# Patient Record
Sex: Male | Born: 1935 | Race: White | Hispanic: No | Marital: Married | Smoking: Never smoker
Health system: Southern US, Community
[De-identification: ages and names within clinical notes are randomized; demographics above are authoritative.]

## PROBLEM LIST (undated history)

## (undated) DIAGNOSIS — N184 Chronic kidney disease, stage 4 (severe): Secondary | ICD-10-CM

## (undated) DIAGNOSIS — M4692 Unspecified inflammatory spondylopathy, cervical region: Secondary | ICD-10-CM

## (undated) DIAGNOSIS — I872 Venous insufficiency (chronic) (peripheral): Secondary | ICD-10-CM

## (undated) DIAGNOSIS — E1129 Type 2 diabetes mellitus with other diabetic kidney complication: Secondary | ICD-10-CM

## (undated) DIAGNOSIS — I119 Hypertensive heart disease without heart failure: Secondary | ICD-10-CM

## (undated) DIAGNOSIS — E785 Hyperlipidemia, unspecified: Secondary | ICD-10-CM

## (undated) DIAGNOSIS — D631 Anemia in chronic kidney disease: Secondary | ICD-10-CM

## (undated) DIAGNOSIS — R0602 Shortness of breath: Secondary | ICD-10-CM

## (undated) DIAGNOSIS — N400 Enlarged prostate without lower urinary tract symptoms: Secondary | ICD-10-CM

## (undated) DIAGNOSIS — I2510 Atherosclerotic heart disease of native coronary artery without angina pectoris: Secondary | ICD-10-CM

## (undated) DIAGNOSIS — E559 Vitamin D deficiency, unspecified: Secondary | ICD-10-CM

## (undated) DIAGNOSIS — I311 Chronic constrictive pericarditis: Secondary | ICD-10-CM

## (undated) DIAGNOSIS — M1990 Unspecified osteoarthritis, unspecified site: Secondary | ICD-10-CM

## (undated) DIAGNOSIS — N039 Chronic nephritic syndrome with unspecified morphologic changes: Secondary | ICD-10-CM

## (undated) DIAGNOSIS — I5022 Chronic systolic (congestive) heart failure: Secondary | ICD-10-CM

## (undated) DIAGNOSIS — E669 Obesity, unspecified: Secondary | ICD-10-CM

## (undated) DIAGNOSIS — E11319 Type 2 diabetes mellitus with unspecified diabetic retinopathy without macular edema: Secondary | ICD-10-CM

## (undated) HISTORY — DX: Type 2 diabetes mellitus with unspecified diabetic retinopathy without macular edema: E11.319

## (undated) HISTORY — PX: CATARACT EXTRACTION W/ INTRAOCULAR LENS  IMPLANT, BILATERAL: SHX1307

## (undated) HISTORY — DX: Chronic nephritic syndrome with unspecified morphologic changes: N03.9

## (undated) HISTORY — DX: Type 2 diabetes mellitus with other diabetic kidney complication: E11.29

## (undated) HISTORY — DX: Obesity, unspecified: E66.9

## (undated) HISTORY — DX: Anemia in chronic kidney disease: D63.1

## (undated) HISTORY — DX: Venous insufficiency (chronic) (peripheral): I87.2

## (undated) HISTORY — DX: Vitamin D deficiency, unspecified: E55.9

## (undated) HISTORY — PX: PERICARDIECTOMY: SHX2214

## (undated) HISTORY — DX: Unspecified inflammatory spondylopathy, cervical region: M46.92

---

## 1979-05-11 HISTORY — PX: INGUINAL HERNIA REPAIR: SUR1180

## 1998-01-23 ENCOUNTER — Ambulatory Visit (HOSPITAL_COMMUNITY): Admission: RE | Admit: 1998-01-23 | Discharge: 1998-01-23 | Payer: Self-pay | Admitting: Ophthalmology

## 1998-08-27 ENCOUNTER — Emergency Department (HOSPITAL_COMMUNITY): Admission: EM | Admit: 1998-08-27 | Discharge: 1998-08-27 | Payer: Self-pay | Admitting: Emergency Medicine

## 1998-08-27 ENCOUNTER — Encounter: Payer: Self-pay | Admitting: Emergency Medicine

## 1999-01-23 ENCOUNTER — Encounter: Payer: Self-pay | Admitting: Emergency Medicine

## 1999-01-24 ENCOUNTER — Inpatient Hospital Stay (HOSPITAL_COMMUNITY): Admission: EM | Admit: 1999-01-24 | Discharge: 1999-02-06 | Payer: Self-pay | Admitting: Internal Medicine

## 1999-01-25 ENCOUNTER — Encounter (HOSPITAL_BASED_OUTPATIENT_CLINIC_OR_DEPARTMENT_OTHER): Payer: Self-pay | Admitting: Internal Medicine

## 1999-01-26 ENCOUNTER — Encounter (HOSPITAL_BASED_OUTPATIENT_CLINIC_OR_DEPARTMENT_OTHER): Payer: Self-pay | Admitting: Internal Medicine

## 1999-01-28 ENCOUNTER — Encounter: Payer: Self-pay | Admitting: Cardiology

## 1999-01-31 ENCOUNTER — Encounter: Payer: Self-pay | Admitting: Surgery

## 1999-02-01 ENCOUNTER — Encounter: Payer: Self-pay | Admitting: Surgery

## 1999-02-02 ENCOUNTER — Encounter: Payer: Self-pay | Admitting: Cardiology

## 1999-02-04 ENCOUNTER — Encounter: Payer: Self-pay | Admitting: Surgery

## 1999-02-05 ENCOUNTER — Encounter: Payer: Self-pay | Admitting: Cardiothoracic Surgery

## 2000-05-22 ENCOUNTER — Other Ambulatory Visit: Admission: RE | Admit: 2000-05-22 | Discharge: 2000-05-22 | Payer: Self-pay | Admitting: Urology

## 2000-05-22 ENCOUNTER — Encounter (INDEPENDENT_AMBULATORY_CARE_PROVIDER_SITE_OTHER): Payer: Self-pay | Admitting: Specialist

## 2001-01-12 ENCOUNTER — Encounter: Admission: RE | Admit: 2001-01-12 | Discharge: 2001-04-12 | Payer: Self-pay | Admitting: Internal Medicine

## 2001-05-01 ENCOUNTER — Encounter: Admission: RE | Admit: 2001-05-01 | Discharge: 2001-07-30 | Payer: Self-pay | Admitting: Internal Medicine

## 2003-09-10 HISTORY — PX: CARDIAC CATHETERIZATION: SHX172

## 2004-07-27 ENCOUNTER — Ambulatory Visit (HOSPITAL_COMMUNITY): Admission: RE | Admit: 2004-07-27 | Discharge: 2004-07-27 | Payer: Self-pay | Admitting: Cardiology

## 2004-12-29 ENCOUNTER — Encounter: Admission: RE | Admit: 2004-12-29 | Discharge: 2004-12-29 | Payer: Self-pay | Admitting: Orthopedic Surgery

## 2005-01-06 ENCOUNTER — Encounter: Admission: RE | Admit: 2005-01-06 | Discharge: 2005-01-06 | Payer: Self-pay | Admitting: Internal Medicine

## 2005-01-17 ENCOUNTER — Encounter: Admission: RE | Admit: 2005-01-17 | Discharge: 2005-01-17 | Payer: Self-pay | Admitting: Orthopedic Surgery

## 2005-01-18 ENCOUNTER — Encounter: Admission: RE | Admit: 2005-01-18 | Discharge: 2005-01-18 | Payer: Self-pay | Admitting: Orthopedic Surgery

## 2005-06-14 ENCOUNTER — Encounter (HOSPITAL_BASED_OUTPATIENT_CLINIC_OR_DEPARTMENT_OTHER): Admission: RE | Admit: 2005-06-14 | Discharge: 2005-07-02 | Payer: Self-pay | Admitting: Surgery

## 2005-12-29 IMAGING — CT CT L SPINE W/ CM
3 of 11 series · 12 of 33 positions shown, 15 images · IV contrast (omnipaque)
Comparison: none

CLINICAL DATA: This patient has pain and weakness in his lower legs bilaterally.  He is referred for lumbar myelogram. 
 LUMBAR MYELOGRAM:
 Following informed consent, using sterile technique and fluoroscopic guidance, a 22 gauge spinal needle was placed into the thecal sac at the L-3 level.  CSF is clear.  15 cc Omnipaque 180 contrast was injected.  The needle was withdrawn and multiple images were obtained. 
 The patient has five non-rib bearing lumbar vertebrae.  Facet degenerative changes lower lumbar spine.  There is degenerative disc disease appreciated at L4-5 and L5-S1.  No appreciable listhesis.  The nerve roots appear to fill normally.

[Series 4: recon 3: l-spine helical · axial · 0.27mm/px · z∈[-229,-79]mm · 5 of 376 slices shown, 7 images]
[im 63/376  soft-tissue]
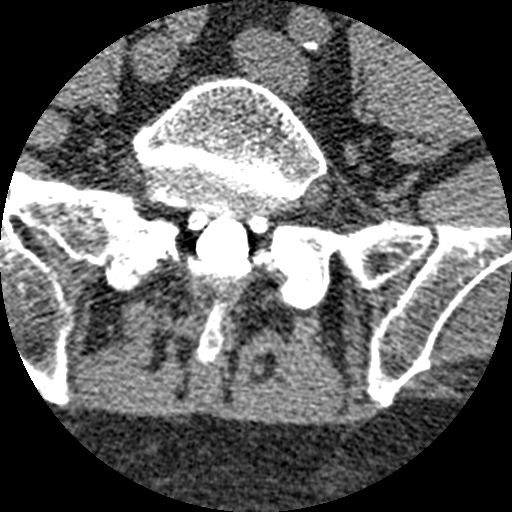
[im 63/376  bone]
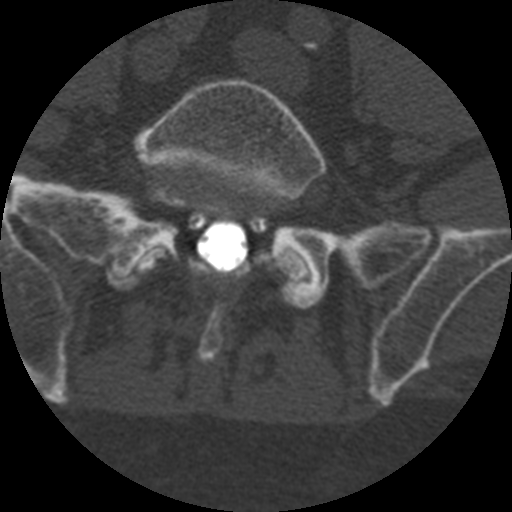
[im 126/376  bone]
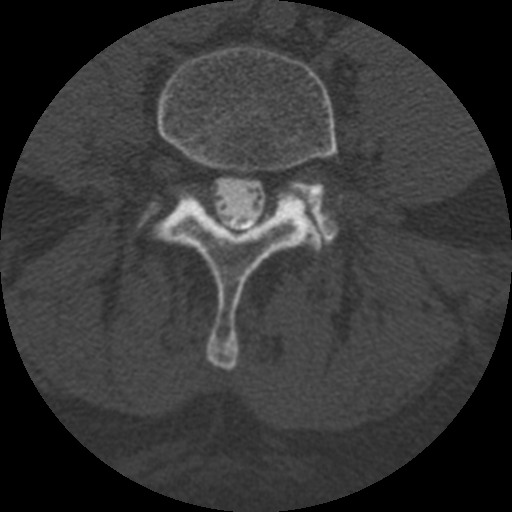
[im 188/376  bone]
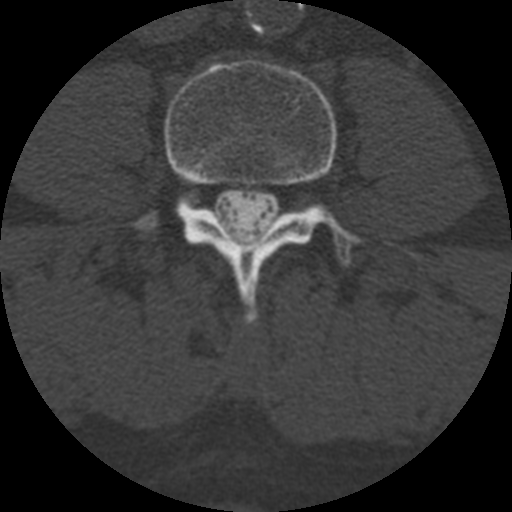
[im 251/376  bone]
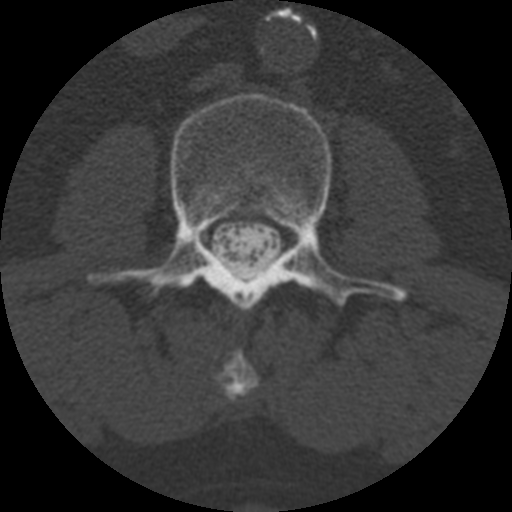
[im 313/376  soft-tissue]
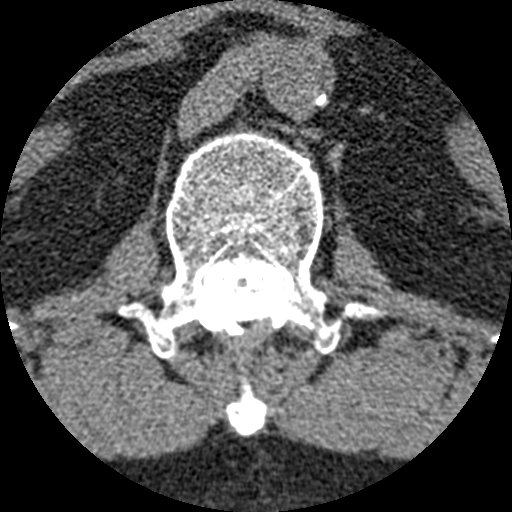
[im 313/376  bone]
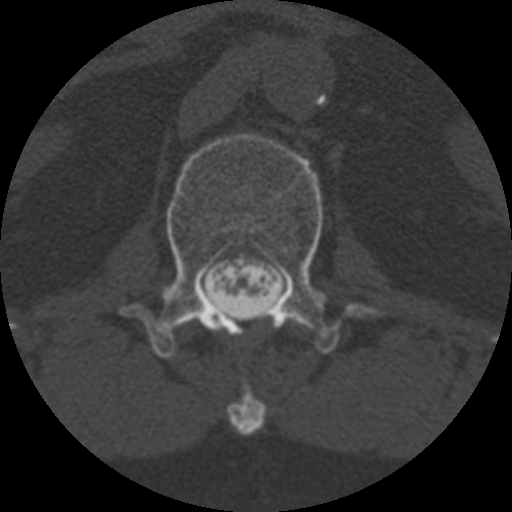

[Series 400: reformatted · sagittal · 0.45mm/px · 5 of 40 slices shown, 6 images (1 of 2)]
[im 14/40  bone]
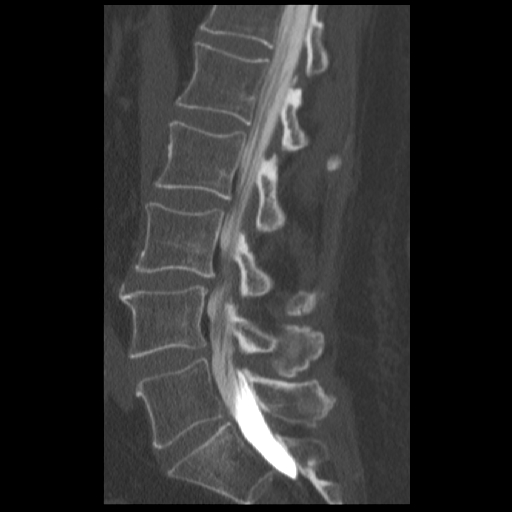
[im 17/40  bone]
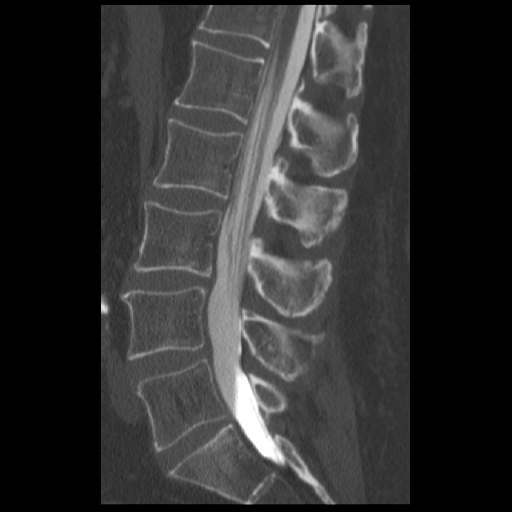
[im 20/40  soft-tissue]
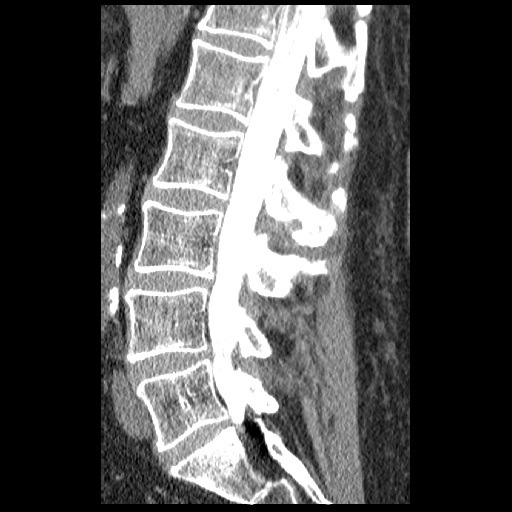
[im 20/40  bone]
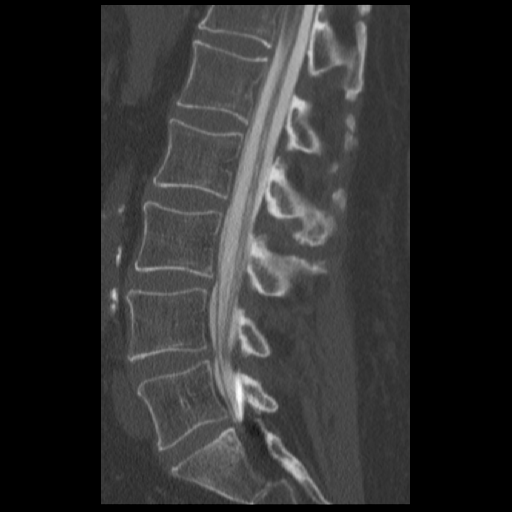
[im 23/40  bone]
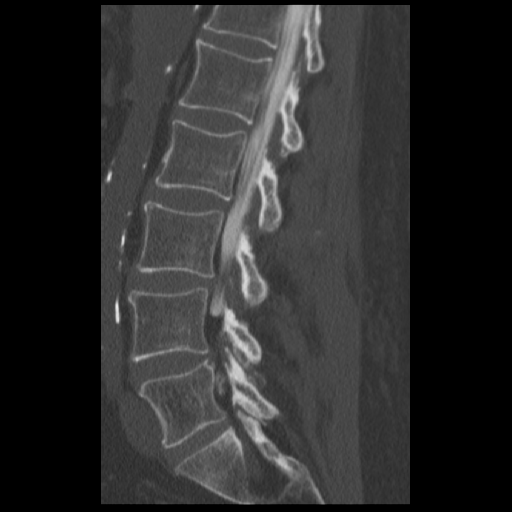
[im 27/40  bone]
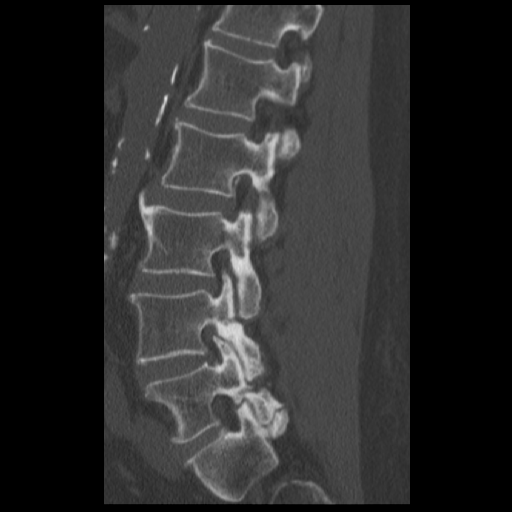

[Series 401: reformatted · coronal · 0.45mm/px · 2 of 20 slices shown (2 of 2)]
[im 4/20  bone]
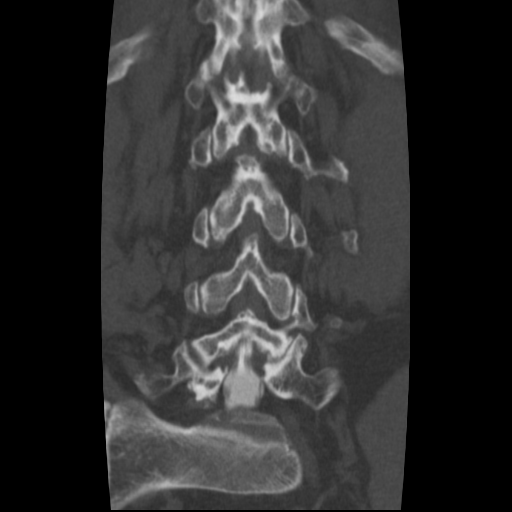
[im 8/20  bone]
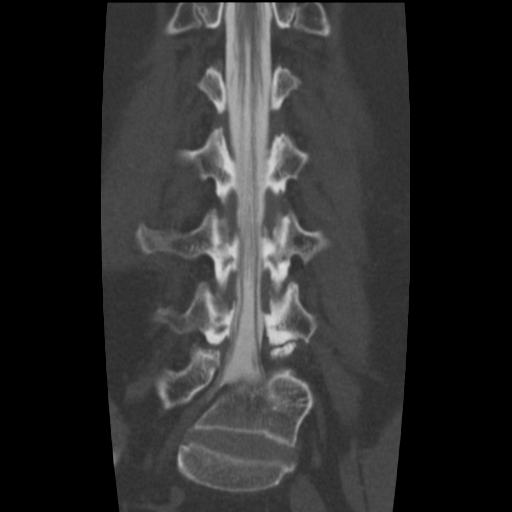

[12 of 33 positions shown; findings below may reference images not displayed]

IMPRESSION: No appreciable stenosis.  CT to follow. 
 CT LUMBAR SPINE POST-INTRATHECAL CONTRAST:
 The conus terminates at L-1 which is unremarkable.  On the sagittal reformat images, shallow disc protrusion is appreciated at L3-4 and L4-5.  There is no appreciable central stenosis.  Multifactorial narrowing of the left L-4 foramen without appreciable encroachment upon the dorsal root ganglion. 
 Axial imaging:
 L1-2:  Negative for central or neuroforaminal stenosis. 
 L2-3:  Mild facet degenerative change.
 L3-4:  Disc bulge without focal protrusion.  Facet degenerative change bilaterally with superior facet bony overgrowth.  This does narrow the inferior foramina left greater than right without appreciable encroachment upon the dorsal root ganglia.   No appreciable lateral recess stenosis. 
 L4-5:  Disc bulge eccentric left.  Facet osteoarthritic change bilaterally.  There is no appreciable central nor lateral recess stenosis.  Narrowing of the inferior left L-4 foramen without encroachment upon the dorsal root ganglion. 
 L5-S1:  Facet degenerative change bilaterally.  Disc bulge.  There is no focal protrusion.  There is no central stenosis.  The L-5 roots exit without encroachment.
IMPRESSION: The patient has multilevel degenerative changes as described above.  There is no appreciable disc herniation.  There is no central stenosis or severe foraminal stenosis to clearly account for the patient?s lower leg symptoms.

## 2006-02-01 ENCOUNTER — Encounter: Admission: RE | Admit: 2006-02-01 | Discharge: 2006-02-01 | Payer: Self-pay | Admitting: Neurology

## 2007-04-13 ENCOUNTER — Ambulatory Visit: Payer: Self-pay | Admitting: Internal Medicine

## 2007-05-27 ENCOUNTER — Ambulatory Visit: Payer: Self-pay | Admitting: Internal Medicine

## 2007-08-11 ENCOUNTER — Encounter: Admission: RE | Admit: 2007-08-11 | Discharge: 2007-08-11 | Payer: Self-pay | Admitting: Internal Medicine

## 2010-01-25 ENCOUNTER — Encounter: Admission: RE | Admit: 2010-01-25 | Discharge: 2010-01-25 | Payer: Self-pay | Admitting: Neurology

## 2010-09-30 ENCOUNTER — Encounter: Payer: Self-pay | Admitting: Internal Medicine

## 2010-09-30 ENCOUNTER — Encounter: Payer: Self-pay | Admitting: Orthopedic Surgery

## 2011-01-22 NOTE — Assessment & Plan Note (Signed)
[facility]                         GASTROENTEROLOGY OFFICE NOTE   ALANZO, LAMB                     MRN:          [id number]  DATE:04/13/2007                            DOB:          [birth year]    GASTROENTEROLOGY CONSULTATION   REASON FOR CONSULTATION:  Screening colonoscopy in an insulin-dependent  diabetic.   HISTORY:  This is a 77 year old white male type 1 diabetic who was  referred through the courtesy of Dr. Jacky Kindle regarding a screening  colonoscopy.  The patient has had type 1 diabetes for 43 years.  He has  been on an insulin pump for 15 years.  He states he underwent flexible  sigmoidoscopy over 10 years ago.  No abnormalities at that time.  Currently without active GI symptoms.  No abdominal pain, change in  bowel habits or bleeding.  He is interested in screening colonoscopy.   PAST MEDICAL HISTORY:  1. Type 1 diabetes x43 years.  2. Pericardial stripping for unspecified reasons.  3. Stasis dermatitis.  4. ALLERGY TO INTRAVENOUS CONTRAST.   CURRENT MEDICATIONS:  1. Benicar 20 mg daily.  2. Aspirin 81 mg daily.  3. Simvastatin 40 mg daily.  4. Furosemide 40 mg daily.  5. NovoLog Insulin pump.   FAMILY HISTORY:  No family history of gastrointestinal malignancy.  Brother with diabetes.   SOCIAL HISTORY:  The patient is married without children.  He lives with  his wife.  He attended high school and college.  He is a Chief of Staff.  He does not smoke or use alcohol.   REVIEW OF SYSTEMS:  Per diagnostic evaluation form.   PHYSICAL EXAMINATION:  GENERAL APPEARANCE:  Elderly male in no acute  distress.  VITAL SIGNS:  Blood pressure 100/56.  Heart rate 68.  Weight is 223  pounds.  He is 5 feet, 9 inches in height.  HEENT:  Sclerae anicteric.  Conjunctivae pink.  Oral mucosa is intact.  NECK:  No adenopathy.  LUNGS:  Clear.  HEART:  Regular.  CHEST:  There is a median sternotomy scar  that is well-healed.  ABDOMEN:  Obese, soft without tenderness, mass or hernia.  Insulin pump  is in place.  EXTREMITIES:  Without edema, although he does have stasis changes  bilaterally.   IMPRESSION:  This is a 77 year old insulin-requiring diabetic who  presents regarding screening colonoscopy.  He is an appropriate  candidate without contraindication.  The nature of the procedure, as  well as the risks, benefits and alternatives have been reviewed.  He  understood and agreed to proceed.  With regards to management of his  diabetes, the patient will continue to monitor his blood sugars the day  before the procedure, carefully, as he would normally.  He understands  appropriate carbohydrate supplementation to avoid hypoglycemia.  We will  schedule his case for the early morning.  He will disconnect his insulin  pump one hour prior to the procedure.  When he arrives, of course, we  will monitor his blood sugar and treat significant irregularities as  needed.     Wilhemina Bonito. Marina Goodell, MD  Electronically Signed    JNP/MedQ  DD: 04/13/2007  DT: 04/13/2007  Job #: [id number]   cc:   Geoffry Paradise, M.D.

## 2011-01-25 NOTE — Consult Note (Signed)
NAME:  Brandon Evans, Brandon Evans NO.:  [id number]   MEDICAL RECORD NO.:  [id number]          PATIENT TYPE:   LOCATION:                                 FACILITY:   PHYSICIAN:  Theresia Majors. Tanda Rockers, M.D.     DATE OF BIRTH:   DATE OF CONSULTATION:  06/17/2005  DATE OF DISCHARGE:                                   CONSULTATION   REASON FOR CONSULTATION:  Brandon Evans is a 77 year old man referred to the  wound center by Dr. Jacky Kindle for the evaluation of bilateral pain, swelling,  and ulceration, which has become more intense in the right leg.   IMPRESSION:  Bilateral stasis changes with the right leg stasis ulcer.   RECOMMENDATIONS:  Proceed with the wound care protocol utilizing an Unna  boot.   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 Over the past three-four months he has developed  progressive pain, swelling, and redness of the lower extremity.  Recently he  has noted increased weeping with pain, which is more intense toward the  evenings.  He denies fevers or chills.   PAST MEDICAL HISTORY:  He has been under the care of Dr. Jacky Kindle for several  years.   CURRENT MEDICATIONS:  Include:  1.  Insulin Novolin insulin pump 55-50 units a day.  2.  Altace 5 mg daily.  3.  Furosemide 40 mg twice a day  4.  Lipitor 40 mg twice a day.  5.  Zetia 10 mg once a day.  6.  Low dose aspirin 81 mg a day.   PAST SURGERY:  Included:  1.  Laser surgery per Dr. Luciana Axe for diabetic retinopathy.  2.  He had a pericardectomy in 1999.   FAMILY HISTORY:  Positive for diabetes.   SOCIAL HISTORY:  He lives with his wife who is a retired Engineer, site.  They have no children.  He is a nonsmoker and does not consume excessive  ethanol.   REVIEW OF SYSTEMS:  Remarkable for relatively stable weight.  His blood  sugars have been under reasonable control with his most recent hemoglobin  A1c of 7.4 on June 12, 2005.  He denies GI or GU  symptoms.  He denies  stigmata of TIAs such as transient loss of sight, paralysis, or speech  difficulty.  The remainder of the review of systems is negative.   PHYSICAL EXAMINATION:  GENERAL:  He is an alert and pleasant man in no acute  distress.  HEENT:  Exam was clear.  NECK:  Is supple.  Trachea is midline.  Thyroid is nonpalpable.  LUNGS:  Clear.  HEART:  Sounds were distant.  ABDOMEN:  Soft.  EXTREMITIES:  Exam is abnormal showing bilateral 2-3+ edema with ulcerations  on the right medial aspect of the lower extremity. The capillary refill is  normal.  Pedal pulses are not definitely discerned.  Protective sensation is  preserved.  There is no regional adenopathy.  Photographs of the wounds have  been taken and the patient has been entered into the wound  expert computer  program.   DISCUSSION:  We will proceed with management of Brandon Evans per the wound  care and hyperbaric center protocol for the management of the stasis ulcer.  We have explained the use of compression wraps to Mr. Cutting in terms that  he seems to understand.  We have specifically addressed the concerns of  tightening of the wraps, which are reflective of increased activity or  increased fluid retention. He has been specifically instructed if the wraps  are to become tight with his activity, he is a to initially get off of his  feet, elevate his feet above his heart for 15 minutes, and if this does not  result in complete resolution of his symptoms he is to remove the wraps  completely.  If he does have to remove the wraps completely he is to call  for a next day appointment for a reapplication of compression.  The patient  seems to understand, expresses gratitude for having been seen in the clinic,  and conveys a willingness to comply with the treatment plan as outlined.           ______________________________  Theresia Majors. Tanda Rockers, M.D.     Cephus Slater  D:  06/18/2005  T:  06/18/2005  Job:  [id number]   cc:    Geoffry Paradise, M.D.  Fax: [phone]

## 2011-01-25 NOTE — Cardiovascular Report (Signed)
NAME:  LABARON, DIGIROLAMO NO.:  [id number]   MEDICAL RECORD NO.:  [id number]          PATIENT TYPE:  OIB   LOCATION:  2899                         FACILITY:  [facility]   PHYSICIAN:  W. 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  He presented  with some exertional dyspnea and had an abnormal Cardiolite scan and reduced  exercise capacity.   COMMENTS ABOUT PROCEDURE:  The patient tolerated the procedure well without  complications.  He received prednisone, Benadryl, and Pepcid for a previous  history of reaction to contrast although this is mostly nausea.  He also had  renal insufficiency and had a bicarbonate infusion.  He tolerated the  procedure well without complications.   HEMODYNAMIC DATA:  1.  Aorta postcontrast:  142/68.  2.  LV postcontrast:  142/9-20.   ANGIOGRAPHIC DATA:   LEFT VENTRICULOGRAM:  Performed in the 30 degree RAO projection.  The aortic  valve was normal.  The mitral valve was normal.  The left ventricle appears  normal in size.  Wall motion is normal.  Estimated ejection fraction is 70%.  Coronary arteries arise and distribute normally.  Calcification is noted in  the proximal left anterior descending.  The left main coronary artery is  normal.   Left anterior descending:  Somewhat eccentric 40% proximal LAD stenosis in  the area of calcification.  A diagonal branch arises and bifurcates and has  an ostial 80% stenosis and proximal 80% stenosis noted.   Circumflex coronary artery is a large vessel with two marginal branches.  Contains mild 10-20% proximal narrowing.   Right coronary artery contains no significant disease.   IMPRESSION:  1.  Coronary artery disease with calcification and mild  to moderate disease      involving the left anterior descending, severe disease involving the      ostium of a small to moderate size diagonal branch.  2.  Normal left ventricular function.   RECOMMENDATIONS:  Intensive medical therapy.  Addition of Plavix.  Control  of blood pressure.  Cardiac rehabilitation.      Kristine Royal   WST/MEDQ  D:  07/27/2004  T:  07/27/2004  Job:  [id number]   cc:   Geoffry Paradise, M.D.  [address]  [place]  [place] [address]  Fax: [phone]

## 2011-09-16 ENCOUNTER — Ambulatory Visit: Payer: Self-pay | Admitting: Neurology

## 2011-10-24 ENCOUNTER — Ambulatory Visit: Payer: Self-pay | Admitting: Neurology

## 2011-10-28 ENCOUNTER — Encounter: Payer: Self-pay | Admitting: Neurology

## 2011-10-28 ENCOUNTER — Ambulatory Visit (INDEPENDENT_AMBULATORY_CARE_PROVIDER_SITE_OTHER): Payer: Medicare Other | Admitting: Neurology

## 2011-10-28 ENCOUNTER — Other Ambulatory Visit (INDEPENDENT_AMBULATORY_CARE_PROVIDER_SITE_OTHER): Payer: Medicare Other

## 2011-10-28 DIAGNOSIS — I998 Other disorder of circulatory system: Secondary | ICD-10-CM

## 2011-10-28 DIAGNOSIS — R29818 Other symptoms and signs involving the nervous system: Secondary | ICD-10-CM

## 2011-10-28 DIAGNOSIS — R42 Dizziness and giddiness: Secondary | ICD-10-CM

## 2011-10-28 DIAGNOSIS — R2689 Other abnormalities of gait and mobility: Secondary | ICD-10-CM

## 2011-10-28 DIAGNOSIS — I999 Unspecified disorder of circulatory system: Secondary | ICD-10-CM

## 2011-10-28 LAB — BASIC METABOLIC PANEL
CO2: 27 mEq/L (ref 19–32)
Calcium: 9 mg/dL (ref 8.4–10.5)

## 2011-10-28 NOTE — Patient Instructions (Addendum)
Your next appointment with Dr. Modesto Charon is scheduled on 4/17 at 3:00 pm.     [phone].   Go to the basement to have your labs drawn today.  Your MRI/ MRA is scheduled at [facility] located at [address] (beside the hospital) on Saturday, Feb. 23 at 8:00 am.  Please arrive 15 minutes prior to your appointment.  [phone].

## 2011-10-28 NOTE — Progress Notes (Signed)
Dear Dr. Jacky Kindle,  Thank you for having me see Brandon Evans in consultation today at [facility] for his problem with dizziness.  As you may recall, he is a 78 y.o. year old male with a history of "dizziness" for about the last year.  The only remarkable event before this happened was a minor head trauma when his feet slipped out from under him and he hit his occiput without loss of consciousness.  He is uncertain whether his dizziness started immediately the event or later.  He describes it as a general feeling of imbalance, that lasts for about 1 minute after quick changes in position.  He denies lightheadedness or the feeling like his is going to pass out.  It is typically brought on when he stands up, but can also be brought on by bending over.  He denies it being brought on by turning in bed, but says he rarely does this.  He has had a progressive hearing loss.  He gets infrequent tinnitus.  This is not related to his dizziness.    PMhx: 1.  Diabetes 2.  CKD 3.  Diabetic retinopathy 4.  HTN 5. HLD 6.  Cervical spondylosis.  Past Surgical History  Procedure Date  . Cardiac surgery     2000  . Hernia repair     30 years ago    History   Social History  . Marital Status: Married    Spouse Name: N/A    Number of Children: N/A  . Years of Education: N/A   Social History Main Topics  . Smoking status: Never Smoker   . Smokeless tobacco: Never Used  . Alcohol Use: No     no  . Drug Use: No  . Sexually Active: Not on file   Other Topics Concern  . Not on file   Social History Narrative  . No narrative on file    FamHx:  no significant neurologic disease.  Meds: 1.  Benicar 2.  furosemide 3.  simvastatin 4.  aspirin 81mg  5.  novolog    ROS:  13 systems were reviewed and are notable for numbness in feet a t times.  He has neck stiffness and pain.  Decreased vision from retinopathy, with multiple laser treatments.  All other review of systems are  unremarkable.   Examination:  Filed Vitals:   10/28/11 1216  BP: 98/50  Pulse: 72  Weight: 231 lb 14.4 oz (105.189 kg)    Recheck orthostatics 116/58 -> 90/50 66->62  In general, well appearing older man.    Cardiovascular: The patient has a regular rate and rhythm.  Fundoscopy:  Limited by pupillary constrction.  Mental status:   The patient is oriented to person, place and time. Recent and remote memory are intact. Attention span and concentration are normal. Language including repetition, naming, following commands are intact. Fund of knowledge of current and historical events, as well as vocabulary are normal.  Cranial Nerves: Pupils are equally round and reactive to light. 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  Extraocular movements are intact without nystagmus. Facial sensation and muscles of mastication are intact. Muscles of facial expression are symmetric. Hearing decreased to bilateral finger rub. Tongue protrusion, uvula, palate midline.  Shoulder shrug intact  Motor:  The patient has normal bulk and tone, no pronator drift.  There are no adventitious movements.  5/5 muscle strength bilaterally.  Reflexes:  Quiet throughout.  Difficult to judge  toes - did not tolerate Babinski.    Coordination:  Normal finger to nose.    Sensation is decreased to temperature and vibration in feet.  Position sense impaired moderately as well.  Gait and Station are wide based.  Romberg is negative.  Dix- Hallpike -; Head thrust not useful due to immobility of neck.     Impression/Recs:  78 year old with long history of diabetes now with orthostatic dizziness that sounds like vertigo.  This could certainly be BPPV.  However, he did have a significant orthostatic drop in SBP of 26 points as well.  I am going to get an MRI brain and MRA head and neck to look at his posterior circulation as well as brainstem and CP angle.  I would  suggest holding his Benicar and perhaps furosemide to see if this makes his symptoms better.  If the MRI is unrevealing and BP med reduction does not help then I would send him for vestibular rehab.    We will see the patient back in 2 months.  Thank you for having [place] see Brandon Evans in consultation.  Feel free to contact me with any questions.  Lupita Raider Modesto Charon, MD [facility], [place] [address] [place], [place] [address] Phone: [phone] Fax: [phone].

## 2011-11-02 ENCOUNTER — Other Ambulatory Visit (HOSPITAL_COMMUNITY): Payer: Medicare Other

## 2011-11-04 ENCOUNTER — Ambulatory Visit (HOSPITAL_COMMUNITY)
Admission: RE | Admit: 2011-11-04 | Discharge: 2011-11-04 | Disposition: A | Discharge status: Home / Self Care | Payer: Medicare Other | Source: Ambulatory Visit | Attending: Neurology | Admitting: Neurology

## 2011-11-04 ENCOUNTER — Other Ambulatory Visit: Payer: Self-pay | Admitting: Neurology

## 2011-11-04 ENCOUNTER — Other Ambulatory Visit (HOSPITAL_COMMUNITY): Payer: Medicare Other

## 2011-11-04 DIAGNOSIS — R42 Dizziness and giddiness: Secondary | ICD-10-CM

## 2011-11-04 DIAGNOSIS — N289 Disorder of kidney and ureter, unspecified: Secondary | ICD-10-CM | POA: Insufficient documentation

## 2011-11-04 DIAGNOSIS — R2689 Other abnormalities of gait and mobility: Secondary | ICD-10-CM

## 2011-11-04 DIAGNOSIS — R52 Pain, unspecified: Secondary | ICD-10-CM

## 2011-11-04 DIAGNOSIS — I639 Cerebral infarction, unspecified: Secondary | ICD-10-CM

## 2011-11-04 DIAGNOSIS — I998 Other disorder of circulatory system: Secondary | ICD-10-CM

## 2011-11-04 DIAGNOSIS — R930 Abnormal findings on diagnostic imaging of skull and head, not elsewhere classified: Secondary | ICD-10-CM | POA: Insufficient documentation

## 2011-11-04 DIAGNOSIS — R269 Unspecified abnormalities of gait and mobility: Secondary | ICD-10-CM | POA: Insufficient documentation

## 2011-11-05 ENCOUNTER — Ambulatory Visit (HOSPITAL_COMMUNITY): Payer: Medicare Other

## 2011-11-05 ENCOUNTER — Other Ambulatory Visit (HOSPITAL_COMMUNITY): Payer: Medicare Other

## 2011-11-05 ENCOUNTER — Ambulatory Visit (HOSPITAL_COMMUNITY): Admission: RE | Admit: 2011-11-05 | Payer: Medicare Other | Source: Ambulatory Visit

## 2011-12-26 ENCOUNTER — Ambulatory Visit: Payer: Medicare Other | Admitting: Neurology

## 2011-12-27 ENCOUNTER — Ambulatory Visit (INDEPENDENT_AMBULATORY_CARE_PROVIDER_SITE_OTHER): Payer: Medicare Other | Admitting: Neurology

## 2011-12-27 ENCOUNTER — Encounter: Payer: Self-pay | Admitting: Neurology

## 2011-12-27 VITALS — BP 96/58 | HR 72 | Wt 232.0 lb

## 2011-12-27 DIAGNOSIS — R569 Unspecified convulsions: Secondary | ICD-10-CM

## 2011-12-27 NOTE — Progress Notes (Signed)
Dear Dr. Jacky Kindle,  Thank you for having me see Brandon Evans in consultation today at [facility] for his problem with dizziness.  As you may recall, he is a 78 y.o. year old male with a history of diabetes and chronic kidney disease who I felt had dizziness due to orthostatic hypotension.  An MRI brain and MRA head and neck were unremarkable except for a tiny 2mm PCOM aneurysm.    He had his lasix reduced in dose about 1 month ago but has had no appreciable difference in his dizziness.  Past Medical History  Diagnosis Date  . Obesity   . Falls   . Dyspnea on exertion   . Hiccups   . Chronic kidney disease (CKD), stage III (moderate)   . DM (diabetes mellitus) type II controlled with renal manifestation   . DM retinopathy   . Spondylitis, cervical   . Edema   . Unspecified venous (peripheral) insufficiency   . Dizziness and giddiness   . Anemia in chronic kidney disease   . DM type 2 with diabetic peripheral neuropathy   . Unspecified vitamin D deficiency   . Congestive heart failure, unspecified   . Mixed hyperlipidemia   . Essential hypertension, benign     Past Surgical History  Procedure Date  . Cardiac surgery     2000  . Hernia repair     30 years ago    History   Social History  . Marital Status: Married    Spouse Name: N/A    Number of Children: N/A  . Years of Education: N/A   Social History Main Topics  . Smoking status: Never Smoker   . Smokeless tobacco: Never Used  . Alcohol Use: No     no  . Drug Use: No  . Sexually Active: None   Other Topics Concern  . None   Social History Narrative  . None    No family history on file.  Current Outpatient Prescriptions on File Prior to Visit  Medication Sig Dispense Refill  . aspirin 81 MG tablet Take 81 mg by mouth daily.      . chlorproMAZINE (THORAZINE) 10 MG tablet Take 10 mg by mouth every 6 (six) hours. Prn hiccups      . furosemide (LASIX) 40 MG tablet Take 40 mg by mouth daily. One  every am and 1/2 at HS      . insulin aspart (NOVOLOG) 100 UNIT/ML injection Inject into the skin 3 (three) times daily before meals. Novolog 100 units/ml.  For use in insulin pump.      . meclizine (ANTIVERT) 25 MG tablet Take 25 mg by mouth 3 (three) times daily as needed.      Marland Kitchen olmesartan (BENICAR) 20 MG tablet Take 20 mg by mouth daily.      . simvastatin (ZOCOR) 40 MG tablet Take 40 mg by mouth at bedtime.      . triamcinolone cream (KENALOG) 0.1 % Apply topically 2 (two) times daily.      . Vitamin D, Ergocalciferol, (DRISDOL) 50000 UNITS CAPS Take 50,000 Units by mouth every 7 (seven) days.        Allergies  Allergen Reactions  . Contrast Media (Iodinated Diagnostic Agents)       ROS:  13 systems were reviewed and are  are unremarkable.   Examination:  Filed Vitals:   12/27/11 1232  BP: 96/58  Pulse: 72  Weight: 232 lb (105.235 kg)    Checked orthostatics 95/55 ->  80/40  Impression/Recs:  1.  Dizziness - I think this is still from over treatment with diuretics/ARB.  I would suggest stopping both as a trial to see if his dizziness improves.  If he doesn't improve, I can see him back and we can investigate causes from peripheral vestibular problems but I think this is less likely. 2.  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  I do not think he needs a neurosurgical referral at this time. .  Thank you for having [place] see Brandon Evans in consultation.  Feel free to contact me with any questions.  Lupita Raider Modesto Charon, MD [facility], [place] [address] [place], [place] [address] Phone: [phone] Fax: [phone].

## 2013-10-15 ENCOUNTER — Inpatient Hospital Stay (HOSPITAL_COMMUNITY)
Admission: AD | Admit: 2013-10-15 | Discharge: 2013-10-19 | DRG: 291 | Disposition: A | Discharge status: Home / Self Care | Payer: Medicare Other | Source: Ambulatory Visit | Attending: Cardiology | Admitting: Cardiology

## 2013-10-15 ENCOUNTER — Encounter (HOSPITAL_COMMUNITY): Payer: Self-pay | Admitting: Cardiology

## 2013-10-15 DIAGNOSIS — E1142 Type 2 diabetes mellitus with diabetic polyneuropathy: Secondary | ICD-10-CM | POA: Diagnosis present

## 2013-10-15 DIAGNOSIS — I5021 Acute systolic (congestive) heart failure: Secondary | ICD-10-CM | POA: Diagnosis present

## 2013-10-15 DIAGNOSIS — I5022 Chronic systolic (congestive) heart failure: Secondary | ICD-10-CM | POA: Insufficient documentation

## 2013-10-15 DIAGNOSIS — E1129 Type 2 diabetes mellitus with other diabetic kidney complication: Secondary | ICD-10-CM | POA: Diagnosis present

## 2013-10-15 DIAGNOSIS — R0601 Orthopnea: Secondary | ICD-10-CM | POA: Diagnosis present

## 2013-10-15 DIAGNOSIS — E1139 Type 2 diabetes mellitus with other diabetic ophthalmic complication: Secondary | ICD-10-CM | POA: Diagnosis present

## 2013-10-15 DIAGNOSIS — I5043 Acute on chronic combined systolic (congestive) and diastolic (congestive) heart failure: Secondary | ICD-10-CM | POA: Diagnosis present

## 2013-10-15 DIAGNOSIS — Z794 Long term (current) use of insulin: Secondary | ICD-10-CM

## 2013-10-15 DIAGNOSIS — N039 Chronic nephritic syndrome with unspecified morphologic changes: Secondary | ICD-10-CM

## 2013-10-15 DIAGNOSIS — I2510 Atherosclerotic heart disease of native coronary artery without angina pectoris: Secondary | ICD-10-CM

## 2013-10-15 DIAGNOSIS — I509 Heart failure, unspecified: Secondary | ICD-10-CM | POA: Diagnosis present

## 2013-10-15 DIAGNOSIS — I130 Hypertensive heart and chronic kidney disease with heart failure and stage 1 through stage 4 chronic kidney disease, or unspecified chronic kidney disease: Principal | ICD-10-CM | POA: Diagnosis present

## 2013-10-15 DIAGNOSIS — E1149 Type 2 diabetes mellitus with other diabetic neurological complication: Secondary | ICD-10-CM | POA: Diagnosis present

## 2013-10-15 DIAGNOSIS — N058 Unspecified nephritic syndrome with other morphologic changes: Secondary | ICD-10-CM | POA: Diagnosis present

## 2013-10-15 DIAGNOSIS — E669 Obesity, unspecified: Secondary | ICD-10-CM | POA: Diagnosis present

## 2013-10-15 DIAGNOSIS — I2589 Other forms of chronic ischemic heart disease: Secondary | ICD-10-CM | POA: Diagnosis present

## 2013-10-15 DIAGNOSIS — N184 Chronic kidney disease, stage 4 (severe): Secondary | ICD-10-CM | POA: Diagnosis present

## 2013-10-15 DIAGNOSIS — N400 Enlarged prostate without lower urinary tract symptoms: Secondary | ICD-10-CM

## 2013-10-15 DIAGNOSIS — Z9641 Presence of insulin pump (external) (internal): Secondary | ICD-10-CM

## 2013-10-15 DIAGNOSIS — I119 Hypertensive heart disease without heart failure: Secondary | ICD-10-CM | POA: Insufficient documentation

## 2013-10-15 DIAGNOSIS — N189 Chronic kidney disease, unspecified: Principal | ICD-10-CM

## 2013-10-15 DIAGNOSIS — E11319 Type 2 diabetes mellitus with unspecified diabetic retinopathy without macular edema: Secondary | ICD-10-CM | POA: Diagnosis present

## 2013-10-15 DIAGNOSIS — D631 Anemia in chronic kidney disease: Secondary | ICD-10-CM | POA: Diagnosis present

## 2013-10-15 DIAGNOSIS — E785 Hyperlipidemia, unspecified: Secondary | ICD-10-CM | POA: Insufficient documentation

## 2013-10-15 DIAGNOSIS — I311 Chronic constrictive pericarditis: Secondary | ICD-10-CM | POA: Insufficient documentation

## 2013-10-15 HISTORY — DX: Chronic systolic (congestive) heart failure: I50.22

## 2013-10-15 HISTORY — DX: Atherosclerotic heart disease of native coronary artery without angina pectoris: I25.10

## 2013-10-15 HISTORY — DX: Benign prostatic hyperplasia without lower urinary tract symptoms: N40.0

## 2013-10-15 HISTORY — DX: Chronic kidney disease, stage 4 (severe): N18.4

## 2013-10-15 HISTORY — DX: Shortness of breath: R06.02

## 2013-10-15 HISTORY — DX: Chronic constrictive pericarditis: I31.1

## 2013-10-15 HISTORY — DX: Unspecified osteoarthritis, unspecified site: M19.90

## 2013-10-15 HISTORY — DX: Hypertensive heart disease without heart failure: I11.9

## 2013-10-15 HISTORY — DX: Hyperlipidemia, unspecified: E78.5

## 2013-10-15 LAB — CBC WITH DIFFERENTIAL/PLATELET
Basophils Absolute: 0 10*3/uL (ref 0.0–0.1)
Basophils Relative: 0 % (ref 0–1)
Eosinophils Absolute: 0.1 10*3/uL (ref 0.0–0.7)
Eosinophils Relative: 0 % (ref 0–5)
HCT: 35.2 % — ABNORMAL LOW (ref 39.0–52.0)
HEMOGLOBIN: 11.8 g/dL — AB (ref 13.0–17.0)
Lymphocytes Relative: 22 % (ref 12–46)
Lymphs Abs: 2.4 10*3/uL (ref 0.7–4.0)
MCH: 33.6 pg (ref 26.0–34.0)
MCHC: 33.5 g/dL (ref 30.0–36.0)
MCV: 100.3 fL — ABNORMAL HIGH (ref 78.0–100.0)
MONO ABS: 1 10*3/uL (ref 0.1–1.0)
MONOS PCT: 9 % (ref 3–12)
Neutro Abs: 7.8 10*3/uL — ABNORMAL HIGH (ref 1.7–7.7)
Neutrophils Relative %: 69 % (ref 43–77)
Platelets: 215 10*3/uL (ref 150–400)
RBC: 3.51 MIL/uL — ABNORMAL LOW (ref 4.22–5.81)
RDW: 14.3 % (ref 11.5–15.5)
WBC: 11.3 10*3/uL — AB (ref 4.0–10.5)

## 2013-10-15 LAB — GLUCOSE, CAPILLARY
GLUCOSE-CAPILLARY: 140 mg/dL — AB (ref 70–99)
Glucose-Capillary: 56 mg/dL — ABNORMAL LOW (ref 70–99)

## 2013-10-15 LAB — COMPREHENSIVE METABOLIC PANEL
ALBUMIN: 3.3 g/dL — AB (ref 3.5–5.2)
ALK PHOS: 51 U/L (ref 39–117)
ALT: 95 U/L — AB (ref 0–53)
AST: 55 U/L — AB (ref 0–37)
BUN: 43 mg/dL — ABNORMAL HIGH (ref 6–23)
CO2: 24 mEq/L (ref 19–32)
Calcium: 8.4 mg/dL (ref 8.4–10.5)
Chloride: 99 mEq/L (ref 96–112)
Creatinine, Ser: 2.31 mg/dL — ABNORMAL HIGH (ref 0.50–1.35)
GFR calc Af Amer: 29 mL/min — ABNORMAL LOW (ref >90)
GFR calc non Af Amer: 25 mL/min — ABNORMAL LOW (ref >90)
Glucose, Bld: 111 mg/dL — ABNORMAL HIGH (ref 70–99)
POTASSIUM: 4.6 meq/L (ref 3.7–5.3)
SODIUM: 138 meq/L (ref 137–147)
TOTAL PROTEIN: 6.5 g/dL (ref 6.0–8.3)
Total Bilirubin: 0.6 mg/dL (ref 0.3–1.2)

## 2013-10-15 LAB — PRO B NATRIURETIC PEPTIDE: Pro B Natriuretic peptide (BNP): 8833 pg/mL — ABNORMAL HIGH (ref 0–450)

## 2013-10-15 LAB — PROTIME-INR
INR: 0.96 (ref 0.00–1.49)
Prothrombin Time: 12.6 seconds (ref 11.6–15.2)

## 2013-10-15 LAB — TROPONIN I: Troponin I: <0.3 ng/mL (ref <0.30)

## 2013-10-15 LAB — APTT: aPTT: 21 seconds — ABNORMAL LOW (ref 24–37)

## 2013-10-15 MED ORDER — CARVEDILOL 6.25 MG PO TABS
6.2500 mg | ORAL_TABLET | Freq: Two times a day (BID) | ORAL | Status: DC
  Administered 2013-10-15 – 2013-10-19 (×8): 6.25 mg via ORAL
  Filled 2013-10-15 (×10): qty 1

## 2013-10-15 MED ORDER — ASPIRIN EC 81 MG PO TBEC
81.0000 mg | DELAYED_RELEASE_TABLET | Freq: Every day | ORAL | Status: DC
  Administered 2013-10-15 – 2013-10-19 (×5): 81 mg via ORAL
  Filled 2013-10-15 (×5): qty 1

## 2013-10-15 MED ORDER — SODIUM CHLORIDE 0.9 % IV SOLN: 250.0000 mL | INTRAVENOUS | Status: DC | PRN

## 2013-10-15 MED ORDER — FUROSEMIDE 10 MG/ML IJ SOLN
80.0000 mg | Freq: Three times a day (TID) | INTRAMUSCULAR | Status: DC
  Administered 2013-10-15 – 2013-10-17 (×5): 80 mg via INTRAVENOUS
  Filled 2013-10-15 (×8): qty 8

## 2013-10-15 MED ORDER — TAMSULOSIN HCL 0.4 MG PO CAPS
0.4000 mg | ORAL_CAPSULE | Freq: Every day | ORAL | Status: DC
Start: 2013-10-15 — End: 2013-10-19
  Administered 2013-10-15 – 2013-10-19 (×5): 0.4 mg via ORAL
  Filled 2013-10-15 (×5): qty 1

## 2013-10-15 MED ORDER — FINASTERIDE 5 MG PO TABS
5.0000 mg | ORAL_TABLET | Freq: Every day | ORAL | Status: DC
  Administered 2013-10-15 – 2013-10-19 (×5): 5 mg via ORAL
  Filled 2013-10-15 (×5): qty 1

## 2013-10-15 MED ORDER — SODIUM CHLORIDE 0.9 % IJ SOLN
3.0000 mL | INTRAMUSCULAR | Status: DC | PRN
  Administered 2013-10-17: 3 mL via INTRAVENOUS

## 2013-10-15 MED ORDER — ENOXAPARIN SODIUM 40 MG/0.4ML ~~LOC~~ SOLN
40.0000 mg | SUBCUTANEOUS | Status: DC
Start: 2013-10-15 — End: 2013-10-16
  Administered 2013-10-15: 40 mg via SUBCUTANEOUS
  Filled 2013-10-15 (×2): qty 0.4

## 2013-10-15 MED ORDER — INSULIN PUMP
Freq: Three times a day (TID) | SUBCUTANEOUS | Status: DC
  Administered 2013-10-16: 14 via SUBCUTANEOUS
  Administered 2013-10-16: 12 via SUBCUTANEOUS
  Administered 2013-10-17 (×2): 10 via SUBCUTANEOUS
  Administered 2013-10-18 (×2): 9 via SUBCUTANEOUS
  Administered 2013-10-19: 9.5 via SUBCUTANEOUS
  Filled 2013-10-15: qty 1

## 2013-10-15 MED ORDER — SODIUM CHLORIDE 0.9 % IJ SOLN
3.0000 mL | Freq: Two times a day (BID) | INTRAMUSCULAR | Status: DC
  Administered 2013-10-15 – 2013-10-18 (×7): 3 mL via INTRAVENOUS

## 2013-10-15 MED ORDER — CLOPIDOGREL BISULFATE 75 MG PO TABS
75.0000 mg | ORAL_TABLET | Freq: Every day | ORAL | Status: DC
  Administered 2013-10-15 – 2013-10-19 (×5): 75 mg via ORAL
  Filled 2013-10-15 (×6): qty 1

## 2013-10-15 MED ORDER — ACETAMINOPHEN 325 MG PO TABS: 650.0000 mg | ORAL_TABLET | ORAL | Status: DC | PRN

## 2013-10-15 NOTE — Progress Notes (Signed)
Dr. Donnie Ahoilley informed pt's BS 56mg /dl and asymptomatic and also on insulin pump. Instructed ok to use as per home.  Diabetic educator paged.  Will continue to monitor.  Amanda PeaNellie Marvell Stavola, Charity fundraiserN.

## 2013-10-15 NOTE — Progress Notes (Signed)
Orders placed and verified by Bjorn Loserhonda, Diabetic educator, who reviewed the order set in the computer and stated they were correct order form.  Oncoming nurse made aware and will go over papers with pt and ones need to sign.  Also informed oncoming nurse to call on MD if need as instructed by Dr. Donnie Ahoilley.    Amanda PeaNellie Garrette Caine, Charity fundraiserN.

## 2013-10-15 NOTE — Progress Notes (Signed)
Pt. Blood sugar 56mg /dl asymptomatic.  Also pt on insulin pump and Dr. Donnie Ahoilley paged so he can be aware of insulin result.  Will continue to monitor.  Amanda PeaNellie Iyonnah Ferrante, Charity fundraiserN.

## 2013-10-15 NOTE — H&P (Signed)
Brandon Evans  Date of visit:  10/15/2013 DOB:  [birth year]    Age:  80 yrs. Medical record number:  [id number]     Account number:  [id number] Primary Care Provider: Geoffry Paradise A ____________________________ CURRENT DIAGNOSES  1. Congestive Heart Failure Acute on Chronic Systolic  2. Congestive Heart Failure Combined S NOS  3. Dyspnea  4. CAD,Native  5. Hypertension-Essential (Benign)  6. Diabetes Mellitus-with Retinopathy IDD  7. Hyperlipidemia  8. Obesity(BMI30-40)  9. Chronic Kidney Disease (Stage 4) ____________________________ ALLERGIES  IVP Dye, Iodine Containing, Intolerance-unknown ____________________________ MEDICATIONS  1. Novolog 100 unit/mL Solution, pump  2. Lyrica 50 mg capsule, BID  3. simvastatin 40 mg tablet, 1 p.o. daily  4. finasteride 5 mg tablet, 1 p.o. daily  5. tamsulosin ER 0.4 mg capsule,extended release 24 hr, 2 qhs  6. aspirin 81 mg chewable tablet, 1 p.o. daily  7. Vitamin D3 1,000 unit tablet, 1 p.o. daily  8. furosemide 40 mg tablet, 2 p.o. Evans.i.d.  9. clopidogrel 75 mg tablet, 1 p.o. daily  10. carvedilol 6.25 mg tablet, BID  11. nitroglycerin 0.4 mg sublingual tablet, Take as directed ____________________________ CHIEF COMPLAINTS  Dyspnea ____________________________ HISTORY OF PRESENT ILLNESS  Patient is admitted to the hospital for treatment of worsening congestive heart failure. He has a long-standing history of diabetes and hyperlipidemia as well as hypertension. He has chronic kidney disease stage 3-4. He had a pericardiectomy for constrictive pericarditis in 2000 by Dr. Laneta Simmers. He has had some moderate coronary artery disease demonstrated at catheterization about 10 years ago. He began to have worsening PND, orthopnea and edema in the latter months of last year and was found to have diffuse anterolateral T wave changes on his EKG. An echocardiogram showed an ejection fraction of around 45% at the end of December. A lexiscan Myoview  done in January showed transient cavity dilation and some inferolateral ischemia. He was placed on treatment with Plavix and his diuretics were increased. He was due to see a nephrologist Monday as his creatinine had risen to 2.6 and prior to interventional evaluation we wanted to get a renal consult. He had been doing well but last week developed worsening shortness of breath and cough and slept fitfully overnight. He did not have any chest pain suggestive of angina. He came to the office this morning where he was found to be 6 pounds of fluid overloaded and had significant crackles, JVD and significant edema. He lives at home with his wife who does not drive and also has significant health issues. There has been a question about whether he has been able to take his medicines properly at home. ____________________________ PAST HISTORY  Past Medical Illnesses:  DM-insulin dependent with retinopathy and neuropathy, hyperlipidemia, obesity, BPH, hypertension, chronic kidney disease Stage 3;  Cardiovascular Illnesses:  Effusive constrictive pericarditis 2000;  Surgical Procedures:  Pericardiectomy 01/1999 Dr. Laneta Simmers, hernia repair;  Cardiology Procedures-Invasive:  cardiac cath (left) November 2005;  Cardiology Procedures-Noninvasive:  echocardiogram, treadmill cardiolite November 2005, echocardiogram December 2014, Lexiscan Myoview January 2015;  Cardiac Cath Results:  normal Left main, calcified proximal LAD, 40% stenosis proximal LAD, 80% stenosis proximal Diag 1, no significant disease CFX, no significant disease RCA;  LVEF of 45% documented via echocardiogram on 09/08/2013,   ____________________________ CARDIO-PULMONARY TEST DATES EKG Date:  10/15/2013;   Cardiac Cath Date:  07/26/2004;  Nuclear Study Date:  09/16/2013;  Echocardiography Date: 09/08/2013;  Chest Xray Date: 07/27/2004;   ____________________________ FAMILY HISTORY Brother -- Coronary Artery Disease,  Diabetes mellitus Father -- Father  dead, Congestive heart failure ____________________________ SOCIAL HISTORY Alcohol Use:  no alcohol use;  Smoking:  never smoked;  Diet:  diabetic diet;  Lifestyle:  married;  Exercise:  no regular exercise;  Occupation:  Audiological scientistreal estate;  Illicit Drug Use:  denies substance abuse;  Residence:  lives with wife;   ____________________________ REVIEW OF SYSTEMS General:  malaise and fatigue, weight gain of approximately 5 lbs  Integumentary:history of vitilago Eyes: diabetic retinopathy, laser treatments Respiratory: cough, dyspnea with exertion Cardiovascular:  please review HPI  Genitourinary-Male: history of levated PSA, BPH, renal insufficiency  Musculoskeletal:  arthritis of the hands Neurological:  denies headaches, stroke, or TIA  ____________________________ PHYSICAL EXAMINATION VITAL SIGNS  Blood Pressure:  124/70 Sitting, Right arm, regular cuff  , 130/70 Standing, Right arm and regular cuff   Pulse:  80/min. Weight:  238.00 lbs. Height:  69"BMI: 35  Constitutional:  pleasant white male in no acute distress, moderately obese Skin:  warm and dry to touch, no apparent skin lesions, or masses noted. Head:  normocephalic, normal hair pattern, no masses or tenderness Eyes:  EOMS Intact, PERRLA, C and S clear, Funduscopic exam not done. ENT:  ears, nose and throat reveal no gross abnormalities.  Dentition good. Neck:  supple, without massess. No  thyromegaly or carotid bruits. Carotid upstroke normal. JVD increased Chest:  fine rales both bases Cardiac:  regular rhythm, normal S1 and S2, soft S4 Abdomen:  abdomen soft,non-tender, no masses, no hepatospenomegaly, or aneurysm noted Peripheral Pulses:  pulses full and equal in all extremities Extremities & Back:  2+ edema Neurological:  no gross motor or sensory deficits noted, affect appropriate, oriented x3. ____________________________ MOST RECENT LIPID PANEL 10/23/04  CHOL TOTL 120 mg/dl, LDL 47 NM, HDL 51 mg/dl, TRIGLYCER 161109 mg/dl and  CHOL/HDL 2.4 (Calc) ____________________________ IMPRESSIONS/PLAN  1. Acute on chronic systolic and diastolic congestive heart failure 2. Coronary artery disease with recent high risk LexiScan Myoview 3. Insulin-dependent diabetes mellitus with retinopathy 4. Hypertension 5. Hyperlipidemia 6. Stage IV chronic kidney disease  Recommendations:  He will be admitted for intravenous diuresis. The decision about what to be going forward is difficult. He has a high risk Myoview study and I would  consider him to be a fairly poor candidate for cardiac intervention due to the potential for worsening his renal function. I'm going to ask the nephrologist to see him while he is in the hospital and also ask for his primary physician to be involved in helping with his diabetic management and situation at home. ____________________________ TODAYS ORDERS  1. 12 Lead EKG: Today  2. Admit to Hospital                       ____________________________ Cardiology Physician:  Darden PalmerW. Spencer Tilley, Jr. MD [facility]

## 2013-10-16 ENCOUNTER — Inpatient Hospital Stay (HOSPITAL_COMMUNITY): Payer: Medicare Other

## 2013-10-16 DIAGNOSIS — N184 Chronic kidney disease, stage 4 (severe): Secondary | ICD-10-CM

## 2013-10-16 DIAGNOSIS — I509 Heart failure, unspecified: Secondary | ICD-10-CM

## 2013-10-16 DIAGNOSIS — E1129 Type 2 diabetes mellitus with other diabetic kidney complication: Secondary | ICD-10-CM

## 2013-10-16 DIAGNOSIS — I5021 Acute systolic (congestive) heart failure: Secondary | ICD-10-CM

## 2013-10-16 DIAGNOSIS — I2510 Atherosclerotic heart disease of native coronary artery without angina pectoris: Secondary | ICD-10-CM

## 2013-10-16 LAB — PROTEIN / CREATININE RATIO, URINE: Creatinine, Urine: 28.19 mg/dL

## 2013-10-16 LAB — RENAL FUNCTION PANEL
ALBUMIN: 3.1 g/dL — AB (ref 3.5–5.2)
BUN: 45 mg/dL — AB (ref 6–23)
CALCIUM: 8.1 mg/dL — AB (ref 8.4–10.5)
CHLORIDE: 100 meq/L (ref 96–112)
CO2: 26 mEq/L (ref 19–32)
CREATININE: 2.68 mg/dL — AB (ref 0.50–1.35)
GFR calc Af Amer: 24 mL/min — ABNORMAL LOW (ref >90)
GFR, EST NON AFRICAN AMERICAN: 21 mL/min — AB (ref >90)
Glucose, Bld: 89 mg/dL (ref 70–99)
Phosphorus: 4.5 mg/dL (ref 2.3–4.6)
Potassium: 3.4 mEq/L — ABNORMAL LOW (ref 3.7–5.3)
Sodium: 141 mEq/L (ref 137–147)

## 2013-10-16 LAB — HEMOGLOBIN A1C
HEMOGLOBIN A1C: 8 % — AB (ref <5.7)
Mean Plasma Glucose: 183 mg/dL — ABNORMAL HIGH (ref <117)

## 2013-10-16 LAB — URINALYSIS, ROUTINE W REFLEX MICROSCOPIC
Bilirubin Urine: NEGATIVE
Glucose, UA: NEGATIVE mg/dL
Hgb urine dipstick: NEGATIVE
Ketones, ur: NEGATIVE mg/dL
LEUKOCYTES UA: NEGATIVE
Nitrite: NEGATIVE
PH: 7 (ref 5.0–8.0)
Protein, ur: NEGATIVE mg/dL
SPECIFIC GRAVITY, URINE: 1.008 (ref 1.005–1.030)
UROBILINOGEN UA: 1 mg/dL (ref 0.0–1.0)

## 2013-10-16 LAB — GLUCOSE, CAPILLARY
GLUCOSE-CAPILLARY: 116 mg/dL — AB (ref 70–99)
GLUCOSE-CAPILLARY: 75 mg/dL (ref 70–99)
Glucose-Capillary: 43 mg/dL — CL (ref 70–99)
Glucose-Capillary: 126 mg/dL — ABNORMAL HIGH (ref 70–99)
Glucose-Capillary: 181 mg/dL — ABNORMAL HIGH (ref 70–99)
Glucose-Capillary: 239 mg/dL — ABNORMAL HIGH (ref 70–99)
Glucose-Capillary: 231 mg/dL — ABNORMAL HIGH (ref 70–99)
Glucose-Capillary: 94 mg/dL (ref 70–99)
Glucose-Capillary: 112 mg/dL — ABNORMAL HIGH (ref 70–99)

## 2013-10-16 LAB — TSH: TSH: 1.832 u[IU]/mL (ref 0.350–4.500)

## 2013-10-16 LAB — TROPONIN I

## 2013-10-16 LAB — BASIC METABOLIC PANEL WITH GFR
BUN: 43 mg/dL — ABNORMAL HIGH (ref 6–23)
CO2: 27 meq/L (ref 19–32)
Calcium: 8.4 mg/dL (ref 8.4–10.5)
Chloride: 104 meq/L (ref 96–112)
Creatinine, Ser: 2.4 mg/dL — ABNORMAL HIGH (ref 0.50–1.35)
GFR calc Af Amer: 27 mL/min — ABNORMAL LOW
GFR calc non Af Amer: 24 mL/min — ABNORMAL LOW
Glucose, Bld: 62 mg/dL — ABNORMAL LOW (ref 70–99)
Potassium: 3.9 meq/L (ref 3.7–5.3)
Sodium: 144 meq/L (ref 137–147)

## 2013-10-16 MED ORDER — ENOXAPARIN SODIUM 30 MG/0.3ML ~~LOC~~ SOLN
30.0000 mg | SUBCUTANEOUS | Status: DC
  Administered 2013-10-17: 30 mg via SUBCUTANEOUS
  Filled 2013-10-16 (×3): qty 0.3

## 2013-10-16 MED ORDER — ENOXAPARIN SODIUM 30 MG/0.3ML ~~LOC~~ SOLN
30.0000 mg | SUBCUTANEOUS | Status: DC
  Filled 2013-10-16: qty 0.3

## 2013-10-16 MED ORDER — ISOSORBIDE DINITRATE 10 MG PO TABS
10.0000 mg | ORAL_TABLET | Freq: Three times a day (TID) | ORAL | Status: DC
  Administered 2013-10-16 – 2013-10-17 (×4): 10 mg via ORAL
  Filled 2013-10-16 (×6): qty 1

## 2013-10-16 MED ORDER — ENOXAPARIN SODIUM 40 MG/0.4ML ~~LOC~~ SOLN
40.0000 mg | SUBCUTANEOUS | Status: DC
  Administered 2013-10-16: 40 mg via SUBCUTANEOUS
  Filled 2013-10-16: qty 0.4

## 2013-10-16 NOTE — Progress Notes (Signed)
I been asked to see the patient regarding assistance with diabetic management. I know him well and normally maintains himself on an insulin pump. 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 He also has multiple home stress orders with the care of his wife and we have had a long discussion about this as well to proceed aggressively versus conservative care. At this point we have embarked upon a course of conservative care. I've attempted to see him twice this morning he is off the floor on both occasions and upon try to track him down radiology and ultrasound is not present there is well. I have reviewed his chart in detail and his current blood sugars range between 102 100s all a safe range given his renal insufficiency and advanced age. Encourage continuation of the insulin pump is arty ordered as he stable manage this will follow and visit went back to the floor.

## 2013-10-16 NOTE — Progress Notes (Signed)
Inpatient Diabetes Program Recommendations  AACE/ADA: New Consensus Statement on Inpatient Glycemic Control (2013)  Target Ranges:  Prepandial:   less than 140 mg/dL      Peak postprandial:   less than 180 mg/dL (1-2 hours)      Critically ill patients:  140 - 180 mg/dL   Reason for Assessment: Paged on 10/16/2013 regarding pt having insulin pump  Diabetes history: Type 2 DM Outpatient Diabetes medications: Insulin Pump per Dr. Jacky KindleAronson Current orders for Inpatient glycemic control: Insulin Pump Order Set  Talked with RN last night regarding how to order the insulin pump. Instructed to put in Insulin Pump under Order sets and RN was able to enter the orders for Dr. Donnie Ahoilley. Discussed Pt Contract needing signature, policy of keeping log of bolused insulin for RN to chart on Doc Flow Sheet. Called and spoke to Fayrene FearingLaura Turner, RN, and requested her to ask pt what his basal rates are, CHO ratio and CF. Pt gets irritated with having to answer questions according to RN and pt states he gives 12 - 14 units of insulin at meals and approx 1.5 units/hour for basal rate. States he does not count CHOs. Had hypoglycemia on 2/6 at 1655 (CBG was 56) and again on 2/7 at 0148 (CBG was 4343)  Dr. Jacky KindleAronson states pt is ok to continue to manage insulin pump while in hospital. Will record basal/bolus rates on 10/18/2013. HgbA1C is 8.0% on 10/15/2013.  Will continue to follow. Thank you. Brandon Evans, RD, LDN, CDE Inpatient Diabetes Coordinator [phone]

## 2013-10-16 NOTE — Consult Note (Signed)
Scharlene GlossWilliam B Whelchel 10/16/2013 Arita MissSANFORD, Anoushka Divito, B Requesting Physician:  Donnie Ahoilley / Croitoru  Reason for Consult:  CKD4 management in setting of acute HF and concern for worsened ischemic cardiomyopathy  HPI:  42M ([facility], intended to see Dr. Hyman HopesWebb this upcoming week) admitted from Dr. York Spanielilley's office with acute on chronic systolic/diastolic HF manifest with weight gain, DOE, PND, and orthopnea.  Has also had high risk nonivasive cardiac imaging with consideration of LHC not yet performed because of pts CKD4.  Has DM2 with microvascular manfiestations.  Unknown currenlty if proteinuria.  Home meds do not include RAAS blockade or NSAIDs.    Admitted and placed on lasix 80 IV TID + coreg and isordil with excellent natriuresis to this point and decreased weight.   He feels better from breathing standpoint but not back to baseline.  No LUTS   Creatinine, Ser (mg/dL)  Date Value  1/6/10962/03/2014 2.40*  10/15/2013 2.31*  10/28/2011 2.3*  ] I/Os: I/O last 3 completed shifts: In: 360 [P.O.:360] Out: 1925 [Urine:1925]   ROS NSAIDS: none IV Contrast none TMP/SMX none Hypotension none Balance of 12 systems is negative w/ exceptions as above  PMH  Past Medical History  Diagnosis Date  . Obesity   . DM retinopathy   . Unspecified venous (peripheral) insufficiency   . Anemia in chronic kidney disease(285.21)   . Unspecified vitamin D deficiency   . CAD (coronary artery disease) 10/15/2013  . Chronic kidney disease (CKD), stage IV (severe)   . BPH (benign prostatic hypertrophy) 10/15/2013  . Chronic systolic CHF (congestive heart failure)   . Hypertensive heart disease   . History of constrictive pericarditis   . Hyperlipidemia   . Shortness of breath     "recently w/lying down and w/exertion" (10/15/2013)  . DM (diabetes mellitus) type II controlled with renal manifestation   . Spondylitis, cervical   . Arthritis     "hands and feet" (10/15/2013)   PSH  Past Surgical History  Procedure  Laterality Date  . Pericardiectomy      2000  . Inguinal hernia repair  1980's  . Cardiac catheterization  2005  . Cataract extraction w/ intraocular lens  implant, bilateral Bilateral ?2000's   FH History reviewed. No pertinent family history. SH  reports that he has never smoked. He has never used smokeless tobacco. He reports that he does not drink alcohol or use illicit drugs. Allergies  Allergies  Allergen Reactions  . Contrast Media [Iodinated Diagnostic Agents]    Home medications Prior to Admission medications   Medication Sig Start Date End Date Taking? Authorizing Provider  aspirin EC 81 MG tablet Take 81 mg by mouth daily.   Yes Historical Provider, MD  carvedilol (COREG) 6.25 MG tablet Take 6.25 mg by mouth 2 (two) times daily with a meal.   Yes Historical Provider, MD  clobetasol cream (TEMOVATE) 0.05 % Apply 1 application topically at bedtime as needed (for rash).    Yes Historical Provider, MD  clopidogrel (PLAVIX) 75 MG tablet Take 75 mg by mouth daily with breakfast.   Yes Historical Provider, MD  finasteride (PROSCAR) 5 MG tablet Take 5 mg by mouth daily.   Yes Historical Provider, MD  furosemide (LASIX) 40 MG tablet Take 40 mg by mouth at bedtime.    Yes Historical Provider, MD  insulin aspart (NOVOLOG) 100 UNIT/ML injection Inject 14 Units into the skin 3 (three) times daily before meals. Novolog 100 units/ml.  For use in insulin pump.  Yes Historical Provider, MD  meclizine (ANTIVERT) 25 MG tablet Take 25 mg by mouth 3 (three) times daily as needed for dizziness.    Yes Historical Provider, MD  nitroGLYCERIN (NITROSTAT) 0.4 MG SL tablet Place 0.4 mg under the tongue every 5 (five) minutes as needed for chest pain.   Yes Historical Provider, MD  predniSONE (DELTASONE) 10 MG tablet Take 10-60 mg by mouth daily with breakfast. Takes 60mg  day 1, then decrease by 10mg  daily for 6 days   Yes Historical Provider, MD  simvastatin (ZOCOR) 40 MG tablet Take 40 mg by mouth at  bedtime.   Yes Historical Provider, MD  tamsulosin (FLOMAX) 0.4 MG CAPS capsule Take 0.4 mg by mouth daily.   Yes Historical Provider, MD  triamcinolone cream (KENALOG) 0.1 % Apply 1 application topically at bedtime as needed (for rash).    Yes Historical Provider, MD    Current Medications Current Facility-Administered Medications  Medication Dose Route Frequency Provider Last Rate Last Dose  . 0.9 %  sodium chloride infusion  250 mL Intravenous PRN Othella Boyer, MD      . acetaminophen (TYLENOL) tablet 650 mg  650 mg Oral Q4H PRN Othella Boyer, MD      . aspirin EC tablet 81 mg  81 mg Oral Daily Othella Boyer, MD   81 mg at 10/16/13 0931  . carvedilol (COREG) tablet 6.25 mg  6.25 mg Oral BID WC Othella Boyer, MD   6.25 mg at 10/16/13 0700  . clopidogrel (PLAVIX) tablet 75 mg  75 mg Oral Q breakfast Othella Boyer, MD   75 mg at 10/16/13 0700  . enoxaparin (LOVENOX) injection 40 mg  40 mg Subcutaneous Q24H Anabel Bene, [facility]      . finasteride (PROSCAR) tablet 5 mg  5 mg Oral Daily Othella Boyer, MD   5 mg at 10/16/13 0932  . furosemide (LASIX) injection 80 mg  80 mg Intravenous Q8H Othella Boyer, MD   80 mg at 10/16/13 0932  . insulin pump   Subcutaneous TID AC, HS, 0200 Othella Boyer, MD      . isosorbide dinitrate (ISORDIL) tablet 10 mg  10 mg Oral TID Thurmon Fair, MD   10 mg at 10/16/13 1138  . sodium chloride 0.9 % injection 3 mL  3 mL Intravenous Q12H Othella Boyer, MD   3 mL at 10/16/13 0932  . sodium chloride 0.9 % injection 3 mL  3 mL Intravenous PRN Othella Boyer, MD      . tamsulosin [facility]) capsule 0.4 mg  0.4 mg Oral Daily Othella Boyer, MD   0.4 mg at 10/16/13 0932    CBC  Recent Labs Lab 10/15/13 1903  WBC 11.3*  NEUTROABS 7.8*  HGB 11.8*  HCT 35.2*  MCV 100.3*  PLT 215   Basic Metabolic Panel  Recent Labs Lab 10/15/13 1903 10/16/13 0138  NA 138 144  K 4.6 3.9  CL 99 104  CO2 24 27  GLUCOSE 111* 62*  BUN 43* 43*   CREATININE 2.31* 2.40*  CALCIUM 8.4 8.4    Physical Exam  Blood pressure 107/45, pulse 60, temperature 97.6 F (36.4 C), temperature source Oral, resp. rate 18, height 5\' 9"  (1.753 m), weight 104.8 kg (231 lb 0.7 oz), SpO2 91.00%. GEN: obese, NAD, lying at 45deg in bed, nl wob ENT: NCAT. Fair dentition EYES: EOMI CV: RRR, nl s1s2. No s3 or s4 PULM: bibasilar crackles o/w CTAB ABD:  s/nt/nd. Nabs. No abd bruits SKIN: no rashes/lesions EXT:1-2+ pitting LEE NEURO: nonfocal PSYCH: nl mood and affect   RENAL US 10/16/13: medical renal disease, no obstruction, normal size  Assessment 12M w/ CKD4 presenting with acute on chronic CHF and concern for progression of CAD  1. CKD4 (BL SCr 2.3-2.4) likely 2/2 DM2, HTN, ASCVD; perhaps at baseline currently 2. Acute on Chronic Systolic/Diastolic HF 3. CAD 4. HTN 5. DM2 with neuropathy, retinopathy, neprhopathy;  A1c 10/15/13 8% 6. Obesity  PLAN 1. Agree with diuretic regimen, natriuresis effective so far 2. UP/C, UA 3. SFLC, SPEP 4. PTH, Phos 5. BID renal panel during aggressive diuresis 6. 2gm Na diet 7. I suspect pt will not further improve with diuretics, will follow for now as we consider LHC 8. No NSAIDs, judicious contrast  Sabra Heck MD 10/16/2013, 1:31 PM

## 2013-10-16 NOTE — Progress Notes (Signed)
Patient Name: Brandon Evans Date of Encounter: 10/16/2013  Principal Problem:   Acute systolic CHF (congestive heart failure) Active Problems:   CAD (coronary artery disease)   Type 2 diabetes mellitus with renal manifestations, controlled   Chronic kidney disease (CKD), stage IV (severe)   Obesity (BMI 30-39.9)   Length of Stay: 1  SUBJECTIVE  Some improvement in dyspnea. Still orthopneic. No edema. Good UO. Little change in renal function so far  CURRENT MEDS . aspirin EC  81 mg Oral Daily  . carvedilol  6.25 mg Oral BID WC  . clopidogrel  75 mg Oral Q breakfast  . enoxaparin (LOVENOX) injection  40 mg Subcutaneous Q24H  . finasteride  5 mg Oral Daily  . furosemide  80 mg Intravenous Q8H  . insulin pump   Subcutaneous TID AC, HS, 0200  . sodium chloride  3 mL Intravenous Q12H  . tamsulosin  0.4 mg Oral Daily    OBJECTIVE   Intake/Output Summary (Last 24 hours) at 10/16/13 0930 Last data filed at 10/16/13 0901  Gross per 24 hour  Intake    600 ml  Output   1925 ml  Net  -1325 ml   Filed Weights   10/15/13 1629 10/16/13 0456 10/16/13 0816  Weight: 105.8 kg (233 lb 4 oz) 106.505 kg (234 lb 12.8 oz) 104.8 kg (231 lb 0.7 oz)    PHYSICAL EXAM Filed Vitals:   10/15/13 2108 10/16/13 0125 10/16/13 0456 10/16/13 0816  BP: 107/50 119/61 107/45   Pulse: 67 67 60   Temp: 98.2 F (36.8 C) 98.1 F (36.7 C) 97.6 F (36.4 C)   TempSrc: Oral Oral Oral   Resp: 18 18 18    Height:      Weight:   106.505 kg (234 lb 12.8 oz) 104.8 kg (231 lb 0.7 oz)  SpO2: 94% 97% 91%    General: Alert, oriented x3, no distress Head: no evidence of trauma, PERRL, EOMI, no exophtalmos or lid lag, no myxedema, no xanthelasma; normal ears, nose and oropharynx Neck: cannot evaluate jugular venous pulsations and hepatojugular reflux due to habitus; brisk carotid pulses without delay and no carotid bruits Chest: clear to auscultation, no signs of consolidation by percussion or palpation,  normal fremitus, symmetrical and full respiratory excursions Cardiovascular: normal position and quality of the apical impulse, regular rhythm, normal first and second heart sounds, no rubs, +ve S3 gallop, no murmur Abdomen: no tenderness or distention, no masses by palpation, no abnormal pulsatility or arterial bruits, normal bowel sounds, no hepatosplenomegaly Extremities: no clubbing, cyanosis or edema; 2+ radial, ulnar and brachial pulses bilaterally; 2+ right femoral, posterior tibial and dorsalis pedis pulses; 2+ left femoral, posterior tibial and dorsalis pedis pulses; no subclavian or femoral bruits Neurological: grossly nonfocal  LABS  CBC  Recent Labs  10/15/13 1903  WBC 11.3*  NEUTROABS 7.8*  HGB 11.8*  HCT 35.2*  MCV 100.3*  PLT 215   Basic Metabolic Panel  Recent Labs  10/15/13 1903 10/16/13 0138  NA 138 144  K 4.6 3.9  CL 99 104  CO2 24 27  GLUCOSE 111* 62*  BUN 43* 43*  CREATININE 2.31* 2.40*  CALCIUM 8.4 8.4   Liver Function Tests  Recent Labs  10/15/13 1903  AST 55*  ALT 95*  ALKPHOS 51  BILITOT 0.6  PROT 6.5  ALBUMIN 3.3*   No results found for this basename: LIPASE, AMYLASE,  in the last 72 hours Cardiac Enzymes  Recent Labs  10/15/13 1903 10/16/13  0138  TROPONINI <0.30 <0.30   BNP No components found with this basename: POCBNP,  D-Dimer No results found for this basename: DDIMER,  in the last 72 hours Hemoglobin A1C  Recent Labs  10/15/13 1903  HGBA1C 8.0*   Fasting Lipid Panel No results found for this basename: CHOL, HDL, LDLCALC, TRIG, CHOLHDL, LDLDIRECT,  in the last 72 hours Thyroid Function Tests  Recent Labs  10/15/13 1903  TSH 1.832    Radiology Studies Imaging results have been reviewed and Dg Chest 2 View  10/16/2013   CLINICAL DATA:  CHF  EXAM: CHEST  2 VIEW  COMPARISON:  07/27/2004  FINDINGS: Cardiac shadow is stable. Postsurgical changes are again noted. The lungs show small bilateral pleural effusions. No  focal infiltrate is seen. No bony abnormality is noted.  IMPRESSION: Small bilateral pleural effusions.   Electronically Signed   By: Alcide CleverMark  Lukens M.D.   On: 10/16/2013 07:33    TELE NSR  ECG pending  ASSESSMENT AND PLAN Acute on chronic heart failure with suspicion of underlying multivessel CAD in a patient with diabetic nephropathy and stage 4 CKD, GFR<30. Will appreciate Nephrology input, but ultimately we have to help him make an educated decision re: risk of angiography and potential progression to renal failure.Continue diuretics - still hypervolemic. BP low, but will try low dose vasodilator therapy. Avoid ACEi/ARB.   Thurmon FairMihai Teryl Mcconaghy, MD, [facility] [phone] office [phone] pager 10/16/2013 9:30 AM

## 2013-10-17 DIAGNOSIS — E669 Obesity, unspecified: Secondary | ICD-10-CM

## 2013-10-17 DIAGNOSIS — I311 Chronic constrictive pericarditis: Secondary | ICD-10-CM

## 2013-10-17 LAB — BASIC METABOLIC PANEL
BUN: 48 mg/dL — ABNORMAL HIGH (ref 6–23)
CHLORIDE: 99 meq/L (ref 96–112)
CO2: 26 mEq/L (ref 19–32)
CREATININE: 2.49 mg/dL — AB (ref 0.50–1.35)
Calcium: 8.2 mg/dL — ABNORMAL LOW (ref 8.4–10.5)
GFR calc Af Amer: 26 mL/min — ABNORMAL LOW (ref >90)
GFR calc non Af Amer: 23 mL/min — ABNORMAL LOW (ref >90)
Glucose, Bld: 201 mg/dL — ABNORMAL HIGH (ref 70–99)
Potassium: 3.4 mEq/L — ABNORMAL LOW (ref 3.7–5.3)
SODIUM: 141 meq/L (ref 137–147)

## 2013-10-17 LAB — GLUCOSE, CAPILLARY
GLUCOSE-CAPILLARY: 205 mg/dL — AB (ref 70–99)
GLUCOSE-CAPILLARY: 172 mg/dL — AB (ref 70–99)
GLUCOSE-CAPILLARY: 111 mg/dL — AB (ref 70–99)
Glucose-Capillary: 158 mg/dL — ABNORMAL HIGH (ref 70–99)
Glucose-Capillary: 105 mg/dL — ABNORMAL HIGH (ref 70–99)

## 2013-10-17 MED ORDER — FUROSEMIDE 80 MG PO TABS
80.0000 mg | ORAL_TABLET | Freq: Every day | ORAL | Status: DC
  Administered 2013-10-17 – 2013-10-19 (×3): 80 mg via ORAL
  Filled 2013-10-17 (×3): qty 1

## 2013-10-17 MED ORDER — POTASSIUM CHLORIDE ER 10 MEQ PO TBCR
20.0000 meq | EXTENDED_RELEASE_TABLET | Freq: Three times a day (TID) | ORAL | Status: AC
  Administered 2013-10-17 (×3): 20 meq via ORAL
  Filled 2013-10-17 (×3): qty 2

## 2013-10-17 MED ORDER — ISOSORBIDE DINITRATE 20 MG PO TABS
20.0000 mg | ORAL_TABLET | Freq: Three times a day (TID) | ORAL | Status: DC
  Administered 2013-10-17 – 2013-10-19 (×6): 20 mg via ORAL
  Filled 2013-10-17 (×8): qty 1

## 2013-10-17 NOTE — Progress Notes (Signed)
Admit: 10/15/2013 LOS: 2  19M w/ CKD4 (BL SCr 2.3-2.4; 2/2 DM/HTN/ASCVD) presenting with acute on chronic CHF and concern for progression of CAD  Subjective:  Switched to PO furosemide 80 daily Cont to diurese well 4.5kg under admit weight No proteinuria on UP/C or UA Dyspnea improved.  Still sleeping upright in bed somewhat  02/07 0701 - 02/08 0700 In: 720 [P.O.:720] Out: 4295 [Urine:4295]  Filed Weights   10/16/13 0456 10/16/13 0816 10/17/13 0504  Weight: 106.505 kg (234 lb 12.8 oz) 104.8 kg (231 lb 0.7 oz) 102.195 kg (225 lb 4.8 oz)    Current meds: reviewed  Current Labs: reviewed    Physical Exam:  Blood pressure 149/48, pulse 56, temperature 97.9 F (36.6 C), temperature source Oral, resp. rate 18, height 5\' 9"  (1.753 m), weight 102.195 kg (225 lb 4.8 oz), SpO2 100.00%. GEN: obese, NAD, lying at 45deg in bed, nl wob  ENT: NCAT. Fair dentition  EYES: EOMI  CV: RRR, nl s1s2. No s3 or s4  PULM: bibasilar crackles o/w CTAB  ABD: s/nt/nd. Nabs. No abd bruits  SKIN: no rashes/lesions  EXT:1-2+ pitting LEE  NEURO: nonfocal  PSYCH: nl mood and affect   RENAL US 10/16/13: medical renal disease, no obstruction, normal size   Assessment  1. CKD4 (BL SCr 2.3-2.4) likely 2/2 DM2, HTN, ASCVD; perhaps at baseline currently, no proteinuria 2. Acute on Chronic Systolic/Diastolic HF 3. CAD, concern for progressive ischemic disease 4. HTN 5. DM2 with neuropathy, retinopathy, neprhopathy; A1c 10/15/13 8% 6. Obesity   PLAN  1. Agree with diuretic regimen, natriuresis effective so far 2. SFLC, SPEP ordered 3. Phos at goal, PTH pending 4. Daily labs now on daily dosed lasix 5. 2gm Na diet 6. As for IV contrast in setting of LHC.  Would determine when euvolemic and then consider keeping steady at that weight without standard pre/post IVFs.  I've discussed risks of AKi and RRT short or long term with patient.   7. No NSAIDs, judicious contrast  Sabra Heckyan Suki Crockett MD 10/17/2013, 11:06  AM   Recent Labs Lab 10/16/13 0138 10/16/13 1550 10/17/13 0422  NA 144 141 141  K 3.9 3.4* 3.4*  CL 104 100 99  CO2 27 26 26   GLUCOSE 62* 89 201*  BUN 43* 45* 48*  CREATININE 2.40* 2.68* 2.49*  CALCIUM 8.4 8.1* 8.2*  PHOS  --  4.5  --     Recent Labs Lab 10/15/13 1903  WBC 11.3*  NEUTROABS 7.8*  HGB 11.8*  HCT 35.2*  MCV 100.3*  PLT 215

## 2013-10-17 NOTE — Progress Notes (Signed)
I'm seeing Brandon Evans today to followup his blood sugars and support the cardiology team. He sitting up in a chair has eaten breakfast and is doing well. His diuresis nicely with creatinine that stabilized in the mid 2 range. This clearly is his baseline and clinically his heart failure appears as well compensated as possible. Blood sugars have been excellent on his insulin pump and I have authorized him to continue managing this himself. Further decision regarding cardiac catheterization up to cardiology team. Clearly with his renal function is higher risk for contrast-induced nephropathy and the need for dialysis. He remains unclear to me as to whether he'll truly benefit from a cardiac intervention this is probably best judged by the cardiology team. My current impression is that given his current quality of life, level of function, comorbidities and age cardiac catheterization although finding anatomy will not miss early lead to an intervention that will change functionality and quality of life. I do defer this to the cardiology team.

## 2013-10-17 NOTE — Progress Notes (Signed)
Patient Name: Brandon Evans Date of Encounter: 10/17/2013  Principal Problem:   Acute systolic CHF (congestive heart failure) Active Problems:   CAD (coronary artery disease)   Type 2 diabetes mellitus with renal manifestations, controlled   Chronic kidney disease (CKD), stage IV (severe)   Obesity (BMI 30-39.9)   Length of Stay: 2  SUBJECTIVE  Substantial subjective improvement. Mild residual orthopnea. Excellent UO , despite which renal function is slightly better.  CURRENT MEDS . aspirin EC  81 mg Oral Daily  . carvedilol  6.25 mg Oral BID WC  . clopidogrel  75 mg Oral Q breakfast  . enoxaparin (LOVENOX) injection  30 mg Subcutaneous Q24H  . finasteride  5 mg Oral Daily  . furosemide  80 mg Intravenous Q8H  . insulin pump   Subcutaneous TID AC, HS, 0200  . isosorbide dinitrate  10 mg Oral TID  . sodium chloride  3 mL Intravenous Q12H  . tamsulosin  0.4 mg Oral Daily    OBJECTIVE   Intake/Output Summary (Last 24 hours) at 10/17/13 0910 Last data filed at 10/17/13 0840  Gross per 24 hour  Intake    720 ml  Output   4695 ml  Net  -3975 ml   Filed Weights   10/16/13 0456 10/16/13 0816 10/17/13 0504  Weight: 106.505 kg (234 lb 12.8 oz) 104.8 kg (231 lb 0.7 oz) 102.195 kg (225 lb 4.8 oz)    PHYSICAL EXAM Filed Vitals:   10/16/13 0816 10/16/13 1415 10/16/13 2047 10/17/13 0504  BP:  109/47 131/52 149/48  Pulse:  61 62 56  Temp:  98 F (36.7 C) 97 F (36.1 C) 97.9 F (36.6 C)  TempSrc:  Oral Axillary Oral  Resp:  18 18 18   Height:      Weight: 104.8 kg (231 lb 0.7 oz)   102.195 kg (225 lb 4.8 oz)  SpO2:  97% 100% 100%   General: Alert, oriented x3, no distress Head: no evidence of trauma, PERRL, EOMI, no exophtalmos or lid lag, no myxedema, no xanthelasma; normal ears, nose and oropharynx Neck: normal jugular venous pulsations and no hepatojugular reflux; brisk carotid pulses without delay and no carotid bruits Chest: clear to auscultation, no signs of  consolidation by percussion or palpation, normal fremitus, symmetrical and full respiratory excursions Cardiovascular: normal position and quality of the apical impulse, regular rhythm, normal first and second heart sounds, no rubs or gallops, no murmur Abdomen: no tenderness or distention, no masses by palpation, no abnormal pulsatility or arterial bruits, normal bowel sounds, no hepatosplenomegaly Extremities: no clubbing, cyanosis or edema; 2+ radial, ulnar and brachial pulses bilaterally; 2+ right femoral, posterior tibial and dorsalis pedis pulses; 2+ left femoral, posterior tibial and dorsalis pedis pulses; no subclavian or femoral bruits Neurological: grossly nonfocal  LABS  CBC  Recent Labs  10/15/13 1903  WBC 11.3*  NEUTROABS 7.8*  HGB 11.8*  HCT 35.2*  MCV 100.3*  PLT 215   Basic Metabolic Panel  Recent Labs  10/16/13 0138 10/16/13 1550 10/17/13 0422  NA 144 141 141  K 3.9 3.4* 3.4*  CL 104 100 99  CO2 27 26 26   GLUCOSE 62* 89 201*  BUN 43* 45* 48*  CREATININE 2.40* 2.68* 2.49*  CALCIUM 8.4 8.1* 8.2*  PHOS  --  4.5  --    Liver Function Tests  Recent Labs  10/15/13 1903 10/16/13 1550  AST 55*  --   ALT 95*  --   ALKPHOS 51  --  BILITOT 0.6  --   PROT 6.5  --   ALBUMIN 3.3* 3.1*   No results found for this basename: LIPASE, AMYLASE,  in the last 72 hours Cardiac Enzymes  Recent Labs  10/15/13 1903 10/16/13 0138  TROPONINI <0.30 <0.30   BNP No components found with this basename: POCBNP,  D-Dimer No results found for this basename: DDIMER,  in the last 72 hours Hemoglobin A1C  Recent Labs  10/15/13 1903  HGBA1C 8.0*   Fasting Lipid Panel No results found for this basename: CHOL, HDL, LDLCALC, TRIG, CHOLHDL, LDLDIRECT,  in the last 72 hours Thyroid Function Tests  Recent Labs  10/15/13 1903  TSH 1.832    Radiology Studies Imaging results have been reviewed and Dg Chest 2 View  10/16/2013   CLINICAL DATA:  CHF  EXAM: CHEST  2  VIEW  COMPARISON:  07/27/2004  FINDINGS: Cardiac shadow is stable. Postsurgical changes are again noted. The lungs show small bilateral pleural effusions. No focal infiltrate is seen. No bony abnormality is noted.  IMPRESSION: Small bilateral pleural effusions.   Electronically Signed   By: Alcide Clever M.D.   On: 10/16/2013 07:33   US Renal  10/16/2013   CLINICAL DATA:  Renal failure history of stage IV chronic renal disease  EXAM: RENAL/URINARY TRACT ULTRASOUND COMPLETE  COMPARISON:  None.  FINDINGS: Right Kidney:  Length: 10.3 cm. 0.9 x 0.9 x 1.1 cm benign-appearing cysts is identified within the mid to upper pole region. There is otherwise appropriate corticomedullary differentiation without evidence of calculi nor solid-appearing masses.  Left Kidney:  Length: 11.8 cm. There is decreased corticomedullary differentiation but evidence of hydronephrosis, gross evidence of masses, nor calculi.  Bladder:  Appears normal for degree of bladder distention.  IMPRESSION: 1. Small benign-appearing cyst upper pole right kidney otherwise unremarkable 2. Increased echogenicity of the left renal cortex consistent with patient's history of medical renal disease. 3. There is no evidence of obstructive uropathy.   Electronically Signed   By: Salome Holmes M.D.   On: 10/16/2013 10:32    TELE PVCs, rare couplet   ASSESSMENT AND PLAN  Mildly hypervolemic still. Switch to PO diuretic. Push up nitrate dose - he seems to tolerate it well. DBP is quite low and I am reluctant to add hydralazine Tomorrow will need to decide whether he should have coronary angio. Appreciate Dr. Lily Lovings consultation.   Thurmon Fair, MD, [facility] [phone] office [phone] pager 10/17/2013 9:10 AM

## 2013-10-18 DIAGNOSIS — N184 Chronic kidney disease, stage 4 (severe): Secondary | ICD-10-CM

## 2013-10-18 DIAGNOSIS — I2510 Atherosclerotic heart disease of native coronary artery without angina pectoris: Secondary | ICD-10-CM

## 2013-10-18 DIAGNOSIS — R0602 Shortness of breath: Secondary | ICD-10-CM

## 2013-10-18 LAB — BASIC METABOLIC PANEL
BUN: 48 mg/dL — ABNORMAL HIGH (ref 6–23)
CO2: 25 mEq/L (ref 19–32)
Calcium: 8.6 mg/dL (ref 8.4–10.5)
Chloride: 103 mEq/L (ref 96–112)
Creatinine, Ser: 2.41 mg/dL — ABNORMAL HIGH (ref 0.50–1.35)
GFR, EST AFRICAN AMERICAN: 27 mL/min — AB (ref >90)
GFR, EST NON AFRICAN AMERICAN: 23 mL/min — AB (ref >90)
Glucose, Bld: 140 mg/dL — ABNORMAL HIGH (ref 70–99)
POTASSIUM: 4 meq/L (ref 3.7–5.3)
Sodium: 142 mEq/L (ref 137–147)

## 2013-10-18 LAB — GLUCOSE, CAPILLARY
GLUCOSE-CAPILLARY: 75 mg/dL (ref 70–99)
GLUCOSE-CAPILLARY: 133 mg/dL — AB (ref 70–99)
Glucose-Capillary: 112 mg/dL — ABNORMAL HIGH (ref 70–99)
Glucose-Capillary: 142 mg/dL — ABNORMAL HIGH (ref 70–99)
Glucose-Capillary: 88 mg/dL (ref 70–99)

## 2013-10-18 LAB — KAPPA/LAMBDA LIGHT CHAINS
Kappa free light chain: 1.86 mg/dL (ref 0.33–1.94)
Kappa, lambda light chain ratio: 1.11 (ref 0.26–1.65)
LAMDA FREE LIGHT CHAINS: 1.67 mg/dL (ref 0.57–2.63)

## 2013-10-18 MED ORDER — PREGABALIN 25 MG PO CAPS
50.0000 mg | ORAL_CAPSULE | Freq: Two times a day (BID) | ORAL | Status: DC
  Administered 2013-10-18 – 2013-10-19 (×3): 50 mg via ORAL
  Filled 2013-10-18 (×3): qty 2

## 2013-10-18 NOTE — Consult Note (Signed)
[address]       [place] [address]             [phone]        Reason for Consult: Abnormal nuclear stress test with probable ischemic congestive heart failure, s/p pericardiectomy for constrictive pericarditis in 2000.  Referring Physician: Dr. Nicholes Stairs is an 80 y.o. male.  HPI:   He has a long history of diabetes, hyperlipidemia, and hypertension as well as Stage IV chronic kidney disease and known moderate coronary disease by cath in 2005. He underwent pericardiectomy for constrictive pericarditis by me in 2000 and has done well until the past few months when he has developed worsening shortness of breath, orthopnea, PND and leg edema. ECG showed diffuse anterolateral T-wave changes and an echo showed an EF of 45%. A lexiscan myoview in Jan 2015 showed transient cavity dilitation and inferolateral ischemia. He was admitted at the end of last week for treatment of CHF. His edema and shortness of breath have improved with diuresis.  Past Medical History  Diagnosis Date  . Obesity   . DM retinopathy   . Unspecified venous (peripheral) insufficiency   . Anemia in chronic kidney disease(285.21)   . Unspecified vitamin D deficiency   . CAD (coronary artery disease) 10/15/2013  . Chronic kidney disease (CKD), stage IV (severe)   . BPH (benign prostatic hypertrophy) 10/15/2013  . Chronic systolic CHF (congestive heart failure)   . Hypertensive heart disease   . History of constrictive pericarditis   . Hyperlipidemia   . Shortness of breath     "recently w/lying down and w/exertion" (10/15/2013)  . DM (diabetes mellitus) type II controlled with renal manifestation   . Spondylitis, cervical   . Arthritis     "hands and feet" (10/15/2013)    Past Surgical History  Procedure Laterality Date  . Pericardiectomy      2000  . Inguinal hernia repair  1980's  . Cardiac catheterization  2005  . Cataract extraction w/ intraocular lens   implant, bilateral Bilateral ?2000's    History reviewed. No pertinent family history.  Social History:  reports that he has never smoked. He has never used smokeless tobacco. He reports that he does not drink alcohol or use illicit drugs.  Allergies:  Allergies  Allergen Reactions  . Contrast Media [Iodinated Diagnostic Agents]     Medications:  I have reviewed the patient's current medications. Prior to Admission:  Prescriptions prior to admission  Medication Sig Dispense Refill  . aspirin EC 81 MG tablet Take 81 mg by mouth daily.      . carvedilol (COREG) 6.25 MG tablet Take 6.25 mg by mouth 2 (two) times daily with a meal.      . clobetasol cream (TEMOVATE) 3.82 % Apply 1 application topically at bedtime as needed (for rash).       . clopidogrel (PLAVIX) 75 MG tablet Take 75 mg by mouth daily with breakfast.      . finasteride (PROSCAR) 5 MG tablet Take 5 mg by mouth daily.      . furosemide (LASIX) 40 MG tablet Take 40 mg by mouth at bedtime.       . insulin aspart (NOVOLOG) 100 UNIT/ML injection Inject 14 Units into the skin 3 (three) times daily before meals. Novolog 100 units/ml.  For use in insulin pump.      . meclizine (ANTIVERT) 25 MG tablet Take 25 mg by  mouth 3 (three) times daily as needed for dizziness.       . nitroGLYCERIN (NITROSTAT) 0.4 MG SL tablet Place 0.4 mg under the tongue every 5 (five) minutes as needed for chest pain.      . predniSONE (DELTASONE) 10 MG tablet Take 10-60 mg by mouth daily with breakfast. Takes $RemoveBeforeDE'60mg'hhuVtjPhtFhGPZV$  day 1, then decrease by $RemoveBef'10mg'HntFYAqikp$  daily for 6 days      . simvastatin (ZOCOR) 40 MG tablet Take 40 mg by mouth at bedtime.      . tamsulosin (FLOMAX) 0.4 MG CAPS capsule Take 0.4 mg by mouth daily.      Marland Kitchen triamcinolone cream (KENALOG) 0.1 % Apply 1 application topically at bedtime as needed (for rash).        Scheduled: . aspirin EC  81 mg Oral Daily  . carvedilol  6.25 mg Oral BID WC  . clopidogrel  75 mg Oral Q breakfast  . enoxaparin  (LOVENOX) injection  30 mg Subcutaneous Q24H  . finasteride  5 mg Oral Daily  . furosemide  80 mg Oral Daily  . insulin pump   Subcutaneous TID AC, HS, 0200  . isosorbide dinitrate  20 mg Oral TID  . pregabalin  50 mg Oral BID  . sodium chloride  3 mL Intravenous Q12H  . tamsulosin  0.4 mg Oral Daily   Continuous:  HBZ:JIRCVE chloride, acetaminophen, sodium chloride Anti-infectives   None      Results for orders placed during the hospital encounter of 10/15/13 (from the past 48 hour(s))  RENAL FUNCTION PANEL     Status: Abnormal   Collection Time    10/16/13  3:50 PM      Result Value Range   Sodium 141  137 - 147 mEq/L   Potassium 3.4 (*) 3.7 - 5.3 mEq/L   Chloride 100  96 - 112 mEq/L   CO2 26  19 - 32 mEq/L   Glucose, Bld 89  70 - 99 mg/dL   BUN 45 (*) 6 - 23 mg/dL   Creatinine, Ser 2.68 (*) 0.50 - 1.35 mg/dL   Calcium 8.1 (*) 8.4 - 10.5 mg/dL   Phosphorus 4.5  2.3 - 4.6 mg/dL   Albumin 3.1 (*) 3.5 - 5.2 g/dL   GFR calc non Af Amer 21 (*) >90 mL/min   GFR calc Af Amer 24 (*) >90 mL/min   Comment: (NOTE)     The eGFR has been calculated using the CKD EPI equation.     This calculation has not been validated in all clinical situations.     eGFR's persistently <90 mL/min signify possible Chronic Kidney     Disease.  GLUCOSE, CAPILLARY     Status: None   Collection Time    10/16/13  4:20 PM      Result Value Range   Glucose-Capillary 94  70 - 99 mg/dL   Comment 1 Notify RN    URINALYSIS, ROUTINE W REFLEX MICROSCOPIC     Status: None   Collection Time    10/16/13  4:35 PM      Result Value Range   Color, Urine YELLOW  YELLOW   APPearance CLEAR  CLEAR   Specific Gravity, Urine 1.008  1.005 - 1.030   pH 7.0  5.0 - 8.0   Glucose, UA NEGATIVE  NEGATIVE mg/dL   Hgb urine dipstick NEGATIVE  NEGATIVE   Bilirubin Urine NEGATIVE  NEGATIVE   Ketones, ur NEGATIVE  NEGATIVE mg/dL   Protein, ur NEGATIVE  NEGATIVE  mg/dL   Urobilinogen, UA 1.0  0.0 - 1.0 mg/dL   Nitrite  NEGATIVE  NEGATIVE   Leukocytes, UA NEGATIVE  NEGATIVE   Comment: MICROSCOPIC NOT DONE ON URINES WITH NEGATIVE PROTEIN, BLOOD, LEUKOCYTES, NITRITE, OR GLUCOSE <1000 mg/dL.  PROTEIN / CREATININE RATIO, URINE     Status: None   Collection Time    10/16/13  4:35 PM      Result Value Range   Creatinine, Urine 28.19     Total Protein, Urine <4.0     Comment: NO NORMAL RANGE ESTABLISHED FOR THIS TEST   PROTEIN CREATININE RATIO         0.00 - 0.15   Comment: RESULT BELOW REPORTABLE RANGE,     UNABLE TO CALCULATE.  GLUCOSE, CAPILLARY     Status: None   Collection Time    10/16/13  8:04 PM      Result Value Range   Glucose-Capillary 75  70 - 99 mg/dL  GLUCOSE, CAPILLARY     Status: Abnormal   Collection Time    10/16/13  9:30 PM      Result Value Range   Glucose-Capillary 112 (*) 70 - 99 mg/dL  GLUCOSE, CAPILLARY     Status: Abnormal   Collection Time    10/17/13  3:29 AM      Result Value Range   Glucose-Capillary 205 (*) 70 - 99 mg/dL  BASIC METABOLIC PANEL     Status: Abnormal   Collection Time    10/17/13  4:22 AM      Result Value Range   Sodium 141  137 - 147 mEq/L   Potassium 3.4 (*) 3.7 - 5.3 mEq/L   Chloride 99  96 - 112 mEq/L   CO2 26  19 - 32 mEq/L   Glucose, Bld 201 (*) 70 - 99 mg/dL   BUN 48 (*) 6 - 23 mg/dL   Creatinine, Ser 2.49 (*) 0.50 - 1.35 mg/dL   Calcium 8.2 (*) 8.4 - 10.5 mg/dL   GFR calc non Af Amer 23 (*) >90 mL/min   GFR calc Af Amer 26 (*) >90 mL/min   Comment: (NOTE)     The eGFR has been calculated using the CKD EPI equation.     This calculation has not been validated in all clinical situations.     eGFR's persistently <90 mL/min signify possible Chronic Kidney     Disease.  KAPPA/LAMBDA LIGHT CHAINS     Status: None   Collection Time    10/17/13  4:22 AM      Result Value Range   Kappa free light chain 1.86  0.33 - 1.94 mg/dL   Lamda free light chains 1.67  0.57 - 2.63 mg/dL   Kappa, lamda light chain ratio 1.11  0.26 - 1.65   Comment:  Performed at [facility], CAPILLARY     Status: Abnormal   Collection Time    10/17/13  7:02 AM      Result Value Range   Glucose-Capillary 158 (*) 70 - 99 mg/dL  GLUCOSE, CAPILLARY     Status: Abnormal   Collection Time    10/17/13 11:03 AM      Result Value Range   Glucose-Capillary 105 (*) 70 - 99 mg/dL   Comment 1 Notify RN    GLUCOSE, CAPILLARY     Status: Abnormal   Collection Time    10/17/13  4:13 PM      Result Value Range   Glucose-Capillary 172 (*)  70 - 99 mg/dL   Comment 1 Notify RN    GLUCOSE, CAPILLARY     Status: Abnormal   Collection Time    10/17/13  9:56 PM      Result Value Range   Glucose-Capillary 111 (*) 70 - 99 mg/dL   Comment 1 Notify RN    GLUCOSE, CAPILLARY     Status: None   Collection Time    10/18/13  3:36 AM      Result Value Range   Glucose-Capillary 75  70 - 99 mg/dL  BASIC METABOLIC PANEL     Status: Abnormal   Collection Time    10/18/13  5:21 AM      Result Value Range   Sodium 142  137 - 147 mEq/L   Potassium 4.0  3.7 - 5.3 mEq/L   Chloride 103  96 - 112 mEq/L   CO2 25  19 - 32 mEq/L   Glucose, Bld 140 (*) 70 - 99 mg/dL   BUN 48 (*) 6 - 23 mg/dL   Creatinine, Ser 2.41 (*) 0.50 - 1.35 mg/dL   Calcium 8.6  8.4 - 10.5 mg/dL   GFR calc non Af Amer 23 (*) >90 mL/min   GFR calc Af Amer 27 (*) >90 mL/min   Comment: (NOTE)     The eGFR has been calculated using the CKD EPI equation.     This calculation has not been validated in all clinical situations.     eGFR's persistently <90 mL/min signify possible Chronic Kidney     Disease.  GLUCOSE, CAPILLARY     Status: Abnormal   Collection Time    10/18/13  6:02 AM      Result Value Range   Glucose-Capillary 133 (*) 70 - 99 mg/dL  GLUCOSE, CAPILLARY     Status: Abnormal   Collection Time    10/18/13 11:17 AM      Result Value Range   Glucose-Capillary 112 (*) 70 - 99 mg/dL   Comment 1 Notify RN      No results found.  Review of Systems  Constitutional: Positive  for malaise/fatigue.       Weight gain  HENT: Negative.   Eyes:       Diabetic eye disease  Respiratory: Positive for cough and shortness of breath. Negative for sputum production.   Cardiovascular: Positive for orthopnea, leg swelling and PND. Negative for chest pain and palpitations.  Gastrointestinal: Negative.   Genitourinary:       BPH  Musculoskeletal:       Arthritis in hands  Skin: Negative.   Neurological:       Peripheral neuropathy  Endo/Heme/Allergies: Negative.   Psychiatric/Behavioral: Negative.    Blood pressure 113/43, pulse 66, temperature 97.6 F (36.4 C), temperature source Oral, resp. rate 20, height $RemoveBe'5\' 9"'KAnhdTEHj$  (1.753 m), weight 101.8 kg (224 lb 6.9 oz), SpO2 99.00%. Physical Exam  Constitutional: He is oriented to person, place, and time.  Elderly, obese gentleman in no distress  HENT:  Head: Normocephalic and atraumatic.  Mouth/Throat: Oropharynx is clear and moist.  Eyes: EOM are normal. Pupils are equal, round, and reactive to light.  Neck: Normal range of motion. Neck supple. No JVD present.  Cardiovascular: Normal rate, regular rhythm and normal heart sounds.   No murmur heard. Respiratory: Effort normal and breath sounds normal. No respiratory distress. He has no rales.  Old sternotomy incision  GI: Soft. Bowel sounds are normal. He exhibits no distension and no mass. There is  no tenderness.  Musculoskeletal:  Mild lower leg edema bilat. Chronic discoloration of both lower legs  Lymphadenopathy:    He has no cervical adenopathy.  Neurological: He is alert and oriented to person, place, and time. He has normal strength. No cranial nerve deficit or sensory deficit.  Skin: Skin is warm and dry.  Psychiatric: He has a normal mood and affect. His behavior is normal. Judgment and thought content normal.    Assessment/Plan:  He probably has ischemic cardiomyopathy and congestive heart failure with an abnormal Myoview exam and ECG but has had no chest pain.  He has known moderate CAD by cath in 2005. I think CABG would be extremely high risk given his previous pericardiectomy, advanced age, and stage IV CKD. The heart is usually firmly adherent to the lungs, sternum and diaphragm and injury to the coronaries, ventricle and lungs is very possible in addition to a long pump run which would likely finish off his kidneys. It is possible that he could have a focal high grade stenosis on cath that can be improved with PCI although diabetes makes it more likely that he has diffuse disease. He has had no chest pain so it may be worthwhile trying conservative medical therapy for his CHF before considering taking the risk of a cath. I discussed this with the patient and his family and answered their questions. They seem to understand the reasons for not recommending surgery if cath showed coronary disease amenable to it.  Kamren Heskett K 10/18/2013, 1:53 PM

## 2013-10-18 NOTE — Progress Notes (Addendum)
Dr. Laneta SimmersBartle and Renal notes reviewed. After further discussion with all, he appears to be high risk for coronary bypass grafting which would be his best long-term option as a diabetic. In the absence of other symptoms at this time will defer consideration of cardiac angiography because of increased risk of renal complications and plan to treat intensively medically. Plan to send home in the morning.  Brandon Evans, Jr. MD [facility]

## 2013-10-18 NOTE — Progress Notes (Signed)
UR completed Alyus Mofield K. Sewell Pitner, RN, BSN, MSHL, CCM  10/18/2013 4:14 PM

## 2013-10-18 NOTE — Progress Notes (Signed)
Subjective:  Clearly feels better. Still has very minimal shortness of breath. No ischemic chest pain.  Objective:  Vital Signs in the last 24 hours: BP 119/90  Pulse 66  Temp(Src) 98.3 F (36.8 C) (Oral)  Resp 19  Ht 5\' 9"  (1.753 m)  Wt 101.8 kg (224 lb 6.9 oz)  BMI 33.13 kg/m2  SpO2 96%  Physical Exam: Obese white male in no acute distress Lungs:  Clear  Cardiac:  Regular rhythm, normal S1 and S2, no definite S3 heard today  Abdomen:  Soft, nontender, no masses Extremities:  Trace edema present  Intake/Output from previous day: 02/08 0701 - 02/09 0700 In: 1200 [P.O.:1200] Out: 2200 [Urine:2200] Weight Filed Weights   10/16/13 0816 10/17/13 0504 10/18/13 0401  Weight: 104.8 kg (231 lb 0.7 oz) 102.195 kg (225 lb 4.8 oz) 101.8 kg (224 lb 6.9 oz)    Lab Results: Basic Metabolic Panel:  Recent Labs  45/40/9801/04/23 0422 10/18/13 0521  NA 141 142  K 3.4* 4.0  CL 99 103  CO2 26 25  GLUCOSE 201* 140*  BUN 48* 48*  CREATININE 2.49* 2.41*    CBC:  Recent Labs  10/15/13 1903  WBC 11.3*  NEUTROABS 7.8*  HGB 11.8*  HCT 35.2*  MCV 100.3*  PLT 215    BNP    Component Value Date/Time   PROBNP 8833.0* 10/15/2013 1903    Telemetry: Sinus bradycardia with PVCs  Assessment/Plan:  1. Acute on chronic systolic and diastolic heart failure which is clinically improved 2. Stage IV chronic kidney disease 3. 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 History of coronary artery disease  Recommendations:  Have asked Dr. Laneta SimmersBartle to come by and see the patient to give us an assessment of operative risk with CABG would be. We'll further discuss with nephrologists. He clearly is at increased risk of renal complications from both contrast as well as interventions and agree with Dr. Lanell MatarAronson's concerns that this may not affect his quality of life. Check back later today.     Brandon Evans, Jr.  MD [facility]  10/18/2013, 9:33 AM

## 2013-10-18 NOTE — Progress Notes (Signed)
The patient did not have any acute changes or complaints overnight.  He was given orange juice at 0330 for a blood sugar of 75.

## 2013-10-18 NOTE — Progress Notes (Signed)
Doing well this morning. Blood sugars are stable. Patient remains on his insulin pump and is managing this nicely. He did change his sat yesterday appropriately. Once again I had discussion regarding angiogram as to whether this would truly lead to an intervention it would change his quality of life her outcome versus the risk related to his renal function. We await Dr. Anastasio Auerbachhiles return today to help make that decision

## 2013-10-18 NOTE — Progress Notes (Signed)
Admit: 10/15/2013 LOS: 3  71M w/ CKD4 (BL SCr 2.3-2.4; 2/2 DM/HTN/ASCVD) presenting with acute on chronic CHF and concern for progression of CAD  Subjective:  Hemodynamically stable Cont to make good urine Kidney function poor but stable Dyspnea improved.  Still sleeping upright in bed somewhat Discussions being held in terms of QOL whether to take on risk of cardiac intervention  02/08 0701 - 02/09 0700 In: 1200 [P.O.:1200] Out: 2200 [Urine:2200]  Filed Weights   10/16/13 0816 10/17/13 0504 10/18/13 0401  Weight: 104.8 kg (231 lb 0.7 oz) 102.195 kg (225 lb 4.8 oz) 101.8 kg (224 lb 6.9 oz)    Current meds: reviewed  Current Labs: reviewed    Physical Exam:  Blood pressure 119/90, pulse 66, temperature 98.3 F (36.8 C), temperature source Oral, resp. rate 19, height 5\' 9"  (1.753 m), weight 101.8 kg (224 lb 6.9 oz), SpO2 96.00%. GEN: obese, NAD, lying at 45deg in bed, nl wob  ENT: NCAT. Fair dentition  EYES: EOMI  CV: RRR, nl s1s2. No s3 or s4  PULM: bibasilar crackles o/w CTAB  ABD: s/nt/nd. Nabs. No abd bruits  SKIN: no rashes/lesions  EXT:1-2+ pitting LEE  NEURO: nonfocal  PSYCH: nl mood and affect   RENAL US 10/16/13: medical renal disease, no obstruction, normal size   Assessment  1. CKD4 (BL SCr 2.3-2.4) likely 2/2 DM2, HTN, ASCVD; perhaps at baseline currently, no proteinuria 2. Acute on Chronic Systolic/Diastolic HF 3. CAD, concern for progressive ischemic disease 4. HTN 5. DM2 with neuropathy, retinopathy, neprhopathy; A1c 10/15/13 8% 6. Obesity   PLAN  1. Agree with diuretic regimen, natriuresis effective so far- kidney function poor but stable.   2. SFLC, SPEP ordered 3. Phos at goal, PTH pending 4. Daily labs now on daily dosed lasix 5. 2gm Na diet 6. As for IV contrast in setting of LHC.   I've also discussed risks of AKi and RRT short or long term with patient.  Dr. Donnie Ahotilley to stop by later and help patient to make decision.  I think is reasonable to  attempt medical therapy first given risks to patient with aggressive intervention.   7. No NSAIDs, judicious contrast if needed.    Halvor Behrend A  10/18/2013, 1:05 PM   Recent Labs Lab 10/16/13 0138 10/16/13 1550 10/17/13 0422 10/18/13 0521  NA 144 141 141 142  K 3.9 3.4* 3.4* 4.0  CL 104 100 99 103  CO2 27 26 26 25   GLUCOSE 62* 89 201* 140*  BUN 43* 45* 48* 48*  CREATININE 2.40* 2.68* 2.49* 2.41*  CALCIUM 8.4 8.1* 8.2* 8.6  PHOS  --  4.5  --   --     Recent Labs Lab 10/15/13 1903  WBC 11.3*  NEUTROABS 7.8*  HGB 11.8*  HCT 35.2*  MCV 100.3*  PLT 215

## 2013-10-19 LAB — GLUCOSE, CAPILLARY
GLUCOSE-CAPILLARY: 106 mg/dL — AB (ref 70–99)
GLUCOSE-CAPILLARY: 69 mg/dL — AB (ref 70–99)
GLUCOSE-CAPILLARY: 174 mg/dL — AB (ref 70–99)
Glucose-Capillary: 101 mg/dL — ABNORMAL HIGH (ref 70–99)

## 2013-10-19 LAB — PROTEIN ELECTROPHORESIS, SERUM
ALBUMIN ELP: 57.6 % (ref 55.8–66.1)
ALPHA-2-GLOBULIN: 13.5 % — AB (ref 7.1–11.8)
Alpha-1-Globulin: 5.1 % — ABNORMAL HIGH (ref 2.9–4.9)
BETA 2: 4.3 % (ref 3.2–6.5)
Beta Globulin: 7.3 % — ABNORMAL HIGH (ref 4.7–7.2)
Gamma Globulin: 12.2 % (ref 11.1–18.8)
M-Spike, %: NOT DETECTED g/dL
TOTAL PROTEIN ELP: 6 g/dL (ref 6.0–8.3)

## 2013-10-19 MED ORDER — ISOSORBIDE DINITRATE 20 MG PO TABS: 20.0000 mg | ORAL_TABLET | Freq: Two times a day (BID) | ORAL | Status: AC

## 2013-10-19 MED ORDER — FUROSEMIDE 80 MG PO TABS: 80.0000 mg | ORAL_TABLET | Freq: Every day | ORAL | Status: AC

## 2013-10-19 MED ORDER — PREGABALIN 50 MG PO CAPS: 50.0000 mg | ORAL_CAPSULE | Freq: Two times a day (BID) | ORAL | Status: AC

## 2013-10-19 NOTE — Progress Notes (Signed)
Inpatient Diabetes Program Recommendations  AACE/ADA: New Consensus Statement on Inpatient Glycemic Control (2013)  Target Ranges:  Prepandial:   less than 140 mg/dL      Peak postprandial:   less than 180 mg/dL (1-2 hours)      Critically ill patients:  140 - 180 mg/dL   Reason for Visit: Insulin Pump  Doing well with insulin pump management. Mild hypoglycemia this am (71). Eating 75-100% meals. Pt wil try conservative therapy for CHF before considering taking risk for a cath. Will likely be discharged today. Excellent diabetes management on insulin pump while in hospital. Will f/u with Dr. Jacky KindleAronson after discharge.  Results for Scharlene GlossSIGMON, Alyssa B (MRN [id number]) as of 10/19/2013 09:07  Ref. Range 10/18/2013 16:10 10/18/2013 21:39 10/19/2013 02:38 10/19/2013 06:21 10/19/2013 09:03  Glucose-Capillary Latest Range: 70-99 mg/dL 782142 (H) 88 956106 (H) 69 (L) 174 (H)   Thank you. Ailene Ardshonda Eliah Ozawa, RD, LDN, CDE Inpatient Diabetes Coordinator [phone]

## 2013-10-19 NOTE — Progress Notes (Signed)
Doing well. TTS, nephrology consults noted. Agree with medical management. CBG excellent. Anticipate discharge today--will sign off.

## 2013-10-19 NOTE — Care Management Note (Addendum)
  Page 2 of 2   10/19/2013     10:32:26 AM   CARE MANAGEMENT NOTE 10/19/2013  Patient:  Brandon Evans,Ed B   Account Number:  [id number]  Date Initiated:  10/19/2013  Documentation initiated by:  Cordel Drewes  Subjective/Objective Assessment:   Admitted with CHF; hx/o DM     Action/Plan:   follow for dispositon f/u needs.   Anticipated DC Date:  10/19/2013   Anticipated DC Plan:  HOME W HOME HEALTH SERVICES      DC Planning Services  CM consult      [facility]   Choice offered to / List presented to:  C-1 Patient        HH arranged  HH-1 RN  HH-10 DISEASE MANAGEMENT      HH agency  [facility]   Status of service:  Completed, signed off Medicare Important Message given?   (If response is "NO", the following Medicare IM given date fields will be blank) Date Medicare IM given:   Date Additional Medicare IM given:    Discharge Disposition:  HOME W HOME HEALTH SERVICES  Per UR Regulation:  Reviewed for med. necessity/level of care/duration of stay  If discussed at Long Length of Stay Meetings, dates discussed:    Comments:  10/19/2013 Social:  from home with wife Hx/o insulin pump Dispositon:  Home with HHS:  RN for Disease MGMT and MED MGMT [facility]  [facility]  Lupita Leash(Donna notiifed) ADD:  today Donato Schultzrystal Kha Hari RN, BSN, MSHL, CCM 10/19/2013

## 2013-10-19 NOTE — Discharge Summary (Signed)
Physician Discharge Summary  Patient ID: Brandon Evans MRN: [id number] DOB/AGE: Feb 29, 1930 80 y.o.  Admit date: 10/15/2013 Discharge date: 10/19/2013  Primary Physician:  Dr. Geoffry Paradise  Primary Discharge Diagnosis:  1. Acute on chronic systolic and diastolic congestive heart failure  Secondary Discharge Diagnosis: 2. Suspected ischemic cardiomyopathy 3. Coronary artery disease 4. Stage IV chronic kidney disease 5. 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 Hypertensive heart disease 6. Hyperlipidemia 7. History of constrictive pericarditis  Consults:  Dr. Sabra Heck, Dr. Jasper Loser Course: Patient was admitted to the hospital for treatment of worsening congestive heart failure. He has a long-standing history of diabetes and hyperlipidemia as well as hypertension. He has chronic kidney disease stage 3-4. He had a pericardiectomy for constrictive pericarditis in 2000 by Dr. Laneta Simmers. He has had some moderate coronary artery disease demonstrated at catheterization about 10 years ago. He began to have worsening PND, orthopnea and edema in the latter months of last year and was found to have diffuse anterolateral T wave changes on his EKG. An echocardiogram showed an ejection fraction of around 45% at the end of December. A lexiscan Myoview done in January showed transient cavity dilation and some inferolateral ischemia. He was placed on treatment with Plavix and his diuretics were increased. He was due to see a nephrologist Monday as his creatinine had risen to 2.6 and prior to interventional evaluation we wanted to get a renal consult. He had been doing well but last week developed worsening shortness of breath and cough and slept fitfully overnight. He did not have any chest pain suggestive of angina. He came to the office this morning where he was found to be 6 pounds of fluid overloaded and had significant crackles, JVD and significant  edema. He lives at home with his wife who does not drive and also has significant health issues. There has been a question about whether he has been able to take his medicines properly at home.  The patient was initially placed on the heart failure floor and was treated with intravenous furosemide. He was seen in consultation by nephrology because of stage IV chronic kidney disease. He was felt to be at increased risk for contrast nephropathy due to his diabetes and stage IV chronic kidney disease. Over the weekend he diuresed around 10 pounds with significant improvement in his symptomatology. Isosorbide was added to his regimen. He improved significantly. He was seen in consultation by Dr. Laneta Simmers who felt that he would be at high risk for coronary bypass grafting which I felt was his best long-term option. Accordingly it was felt after consultation with nephrology, cardiac surgery and his internist that it would be best to treat him intensively medically as we would be unlikely to improve his quality of life over what it is now without significant risk to him including the possible risk of future dialysis. Arrangements are made for him to have home health care to monitor him as an outpatient he will be discharged today with followup in one week. He was instructed to weigh daily and to follow a low-sodium diet.   Discharge Exam: Blood pressure 116/54, pulse 68, temperature 97.9 F (36.6 C), temperature source Oral, resp. rate 16, height 5\' 9"  (1.753 m), weight 101.5 kg (223 lb 12.3 oz), SpO2 94.00%. normal S1-S2, no S3, no peripheral edema  Labs: CBC:   Lab Results  Component Value Date   WBC 11.3* 10/15/2013   HGB 11.8* 10/15/2013  HCT 35.2* 10/15/2013   MCV 100.3* 10/15/2013   PLT 215 10/15/2013    CMP:  Recent Labs Lab 10/15/13 1903  10/18/13 0521  NA 138  < > 142  K 4.6  < > 4.0  CL 99  < > 103  CO2 24  < > 25  BUN 43*  < > 48*  CREATININE 2.31*  < > 2.41*  CALCIUM 8.4  < > 8.6  PROT 6.5   --   --   BILITOT 0.6  --   --   ALKPHOS 51  --   --   ALT 95*  --   --   AST 55*  --   --   GLUCOSE 111*  < > 140*  < > = values in this interval not displayed.  BNP (last 3 results)  Recent Labs  10/15/13 1903  PROBNP 8833.0*    Protime: No components found with this basename: PT,   Thyroid: Lab Results  Component Value Date   TSH 1.832 10/15/2013    Hemoglobin A1C: Lab Results  Component Value Date   HGBA1C 8.0* 10/15/2013     Radiology: Cardiomegaly with small bilateral pleural effusions  EKG: Sinus rhythm, inferolateral ischemia  Discharge Medications:   Medication List    STOP taking these medications       predniSONE 10 MG tablet  Commonly known as:  DELTASONE      TAKE these medications       aspirin EC 81 MG tablet  Take 81 mg by mouth daily.     carvedilol 6.25 MG tablet  Commonly known as:  COREG  Take 6.25 mg by mouth 2 (two) times daily with a meal.     clobetasol cream 0.05 %  Commonly known as:  TEMOVATE  Apply 1 application topically at bedtime as needed (for rash).     clopidogrel 75 MG tablet  Commonly known as:  PLAVIX  Take 75 mg by mouth daily with breakfast.     finasteride 5 MG tablet  Commonly known as:  PROSCAR  Take 5 mg by mouth daily.     furosemide 80 MG tablet  Commonly known as:  LASIX  Take 1 tablet (80 mg total) by mouth daily.     insulin aspart 100 UNIT/ML injection  Commonly known as:  novoLOG  Inject 14 Units into the skin 3 (three) times daily before meals. Novolog 100 units/ml.  For use in insulin pump.     meclizine 25 MG tablet  Commonly known as:  ANTIVERT  Take 25 mg by mouth 3 (three) times daily as needed for dizziness.     nitroGLYCERIN 0.4 MG SL tablet  Commonly known as:  NITROSTAT  Place 0.4 mg under the tongue every 5 (five) minutes as needed for chest pain.     pregabalin 50 MG capsule  Commonly known as:  LYRICA  Take 1 capsule (50 mg total) by mouth 2 (two) times daily.      simvastatin 40 MG tablet  Commonly known as:  ZOCOR  Take 40 mg by mouth at bedtime.     tamsulosin 0.4 MG Caps capsule  Commonly known as:  FLOMAX  Take 0.4 mg by mouth daily.     triamcinolone cream 0.1 %  Commonly known as:  KENALOG  Apply 1 application topically at bedtime as needed (for rash).         Followup plans and appointments: Dr. Donnie Ahoilley in one week  Time spent with  patient to include physician time:  45 minutes  Signed: W. Ashley Royalty. MD [facility] 10/19/2013, 8:51 AM

## 2013-10-19 NOTE — Progress Notes (Signed)
Admit: 10/15/2013 LOS: 4  42M w/ CKD4 (BL SCr 2.3-2.4; 2/2 DM/HTN/ASCVD) presenting with acute on chronic CHF and concern for progression of CAD  Subjective:  Hemodynamically stable Cont to make good urine Kidney function poor but stable I see plans for discharge today, medical management of CAD. - actually on the way out the door    02/09 0701 - 02/10 0700 In: 960 [P.O.:960] Out: 1000 [Urine:1000]  Filed Weights   10/17/13 0504 10/18/13 0401 10/19/13 0418  Weight: 102.195 kg (225 lb 4.8 oz) 101.8 kg (224 lb 6.9 oz) 101.5 kg (223 lb 12.3 oz)    Current meds: reviewed  Current Labs: reviewed    Physical Exam:  Blood pressure 119/54, pulse 58, temperature 97.9 F (36.6 C), temperature source Oral, resp. rate 16, height 5\' 9"  (1.753 m), weight 101.5 kg (223 lb 12.3 oz), SpO2 94.00%. GEN: obese, NAD, lying at 45deg in bed, nl wob  ENT: NCAT. Fair dentition  EYES: EOMI  CV: RRR, nl s1s2. No s3 or s4  PULM: bibasilar crackles o/w CTAB  ABD: s/nt/nd. Nabs. No abd bruits  SKIN: no rashes/lesions  EXT:1-2+ pitting LEE  NEURO: nonfocal  PSYCH: nl mood and affect   RENAL US 10/16/13: medical renal disease, no obstruction, normal size   Assessment  1. CKD4 (BL SCr 2.3-2.4) likely 2/2 DM2, HTN, ASCVD; perhaps at baseline currently, no proteinuria.  Kidney function stable but poor - I will arrange OP follow up with me 2. Acute on Chronic Systolic/Diastolic HF 3. CAD, concern for progressive ischemic disease 4. HTN 5. DM2 with neuropathy, retinopathy, neprhopathy; A1c 10/15/13 8% 6. Obesity   PLAN  1. Agree with diuretic regimen-  kidney function poor but stable.   2. SFLC, SPEP  Neg  3. Phos at goal, PTH not done 4. Daily labs now on daily dosed lasix 5. 2gm Na diet 6. I agree with medical management of CKD  7. No NSAIDs, judicious contrast if needed.    Rana Adorno A  10/19/2013, 12:10 PM   Recent Labs Lab 10/16/13 0138 10/16/13 1550 10/17/13 0422 10/18/13 0521   NA 144 141 141 142  K 3.9 3.4* 3.4* 4.0  CL 104 100 99 103  CO2 27 26 26 25   GLUCOSE 62* 89 201* 140*  BUN 43* 45* 48* 48*  CREATININE 2.40* 2.68* 2.49* 2.41*  CALCIUM 8.4 8.1* 8.2* 8.6  PHOS  --  4.5  --   --     Recent Labs Lab 10/15/13 1903  WBC 11.3*  NEUTROABS 7.8*  HGB 11.8*  HCT 35.2*  MCV 100.3*  PLT 215

## 2013-10-19 NOTE — Progress Notes (Signed)
The patient did not have any complaints or acute changes overnight.

## 2013-10-19 NOTE — Progress Notes (Signed)
IV d/c'd.  Tele d/c'd.  Pt d/c'd to home.  Home meds and d/c instructions have been reviewed with pt.  Pt denies any questions or concerns.  Pt leaving unit via wheelchair and appears in no acute distress. Nino Glowourtney Sirius Woodford RN

## 2014-05-08 ENCOUNTER — Encounter (HOSPITAL_COMMUNITY): Payer: Self-pay | Admitting: Emergency Medicine

## 2014-05-08 ENCOUNTER — Emergency Department (HOSPITAL_COMMUNITY)
Admission: EM | Admit: 2014-05-08 | Discharge: 2014-05-08 | Disposition: A | Discharge status: Home / Self Care | Payer: Medicare Other | Attending: Emergency Medicine | Admitting: Emergency Medicine

## 2014-05-08 DIAGNOSIS — N184 Chronic kidney disease, stage 4 (severe): Secondary | ICD-10-CM | POA: Diagnosis not present

## 2014-05-08 DIAGNOSIS — I5022 Chronic systolic (congestive) heart failure: Secondary | ICD-10-CM | POA: Insufficient documentation

## 2014-05-08 DIAGNOSIS — E11319 Type 2 diabetes mellitus with unspecified diabetic retinopathy without macular edema: Secondary | ICD-10-CM | POA: Diagnosis not present

## 2014-05-08 DIAGNOSIS — E785 Hyperlipidemia, unspecified: Secondary | ICD-10-CM | POA: Diagnosis not present

## 2014-05-08 DIAGNOSIS — E669 Obesity, unspecified: Secondary | ICD-10-CM | POA: Insufficient documentation

## 2014-05-08 DIAGNOSIS — E1129 Type 2 diabetes mellitus with other diabetic kidney complication: Secondary | ICD-10-CM | POA: Insufficient documentation

## 2014-05-08 DIAGNOSIS — Z79899 Other long term (current) drug therapy: Secondary | ICD-10-CM | POA: Insufficient documentation

## 2014-05-08 DIAGNOSIS — M129 Arthropathy, unspecified: Secondary | ICD-10-CM | POA: Diagnosis not present

## 2014-05-08 DIAGNOSIS — I2510 Atherosclerotic heart disease of native coronary artery without angina pectoris: Secondary | ICD-10-CM | POA: Insufficient documentation

## 2014-05-08 DIAGNOSIS — E1169 Type 2 diabetes mellitus with other specified complication: Secondary | ICD-10-CM | POA: Insufficient documentation

## 2014-05-08 DIAGNOSIS — IMO0002 Reserved for concepts with insufficient information to code with codable children: Secondary | ICD-10-CM | POA: Insufficient documentation

## 2014-05-08 DIAGNOSIS — E1139 Type 2 diabetes mellitus with other diabetic ophthalmic complication: Secondary | ICD-10-CM | POA: Insufficient documentation

## 2014-05-08 DIAGNOSIS — Z7982 Long term (current) use of aspirin: Secondary | ICD-10-CM | POA: Insufficient documentation

## 2014-05-08 DIAGNOSIS — I119 Hypertensive heart disease without heart failure: Secondary | ICD-10-CM | POA: Diagnosis not present

## 2014-05-08 DIAGNOSIS — Z9889 Other specified postprocedural states: Secondary | ICD-10-CM | POA: Insufficient documentation

## 2014-05-08 DIAGNOSIS — Z794 Long term (current) use of insulin: Secondary | ICD-10-CM | POA: Diagnosis not present

## 2014-05-08 DIAGNOSIS — Z862 Personal history of diseases of the blood and blood-forming organs and certain disorders involving the immune mechanism: Secondary | ICD-10-CM | POA: Insufficient documentation

## 2014-05-08 DIAGNOSIS — E162 Hypoglycemia, unspecified: Secondary | ICD-10-CM

## 2014-05-08 LAB — COMPREHENSIVE METABOLIC PANEL
ALK PHOS: 90 U/L (ref 39–117)
ALT: 14 U/L (ref 0–53)
ANION GAP: 13 (ref 5–15)
AST: 21 U/L (ref 0–37)
Albumin: 3.4 g/dL — ABNORMAL LOW (ref 3.5–5.2)
BILIRUBIN TOTAL: 0.5 mg/dL (ref 0.3–1.2)
BUN: 27 mg/dL — ABNORMAL HIGH (ref 6–23)
CO2: 23 mEq/L (ref 19–32)
CREATININE: 1.88 mg/dL — AB (ref 0.50–1.35)
Calcium: 9.2 mg/dL (ref 8.4–10.5)
Chloride: 105 mEq/L (ref 96–112)
GFR calc Af Amer: 37 mL/min — ABNORMAL LOW (ref >90)
GFR calc non Af Amer: 32 mL/min — ABNORMAL LOW (ref >90)
GLUCOSE: 174 mg/dL — AB (ref 70–99)
POTASSIUM: 4.7 meq/L (ref 3.7–5.3)
Sodium: 141 mEq/L (ref 137–147)
TOTAL PROTEIN: 7.2 g/dL (ref 6.0–8.3)

## 2014-05-08 LAB — CBC
HEMATOCRIT: 39.7 % (ref 39.0–52.0)
Hemoglobin: 12.7 g/dL — ABNORMAL LOW (ref 13.0–17.0)
MCH: 32.2 pg (ref 26.0–34.0)
MCHC: 32 g/dL (ref 30.0–36.0)
MCV: 100.8 fL — ABNORMAL HIGH (ref 78.0–100.0)
Platelets: 313 10*3/uL (ref 150–400)
RBC: 3.94 MIL/uL — ABNORMAL LOW (ref 4.22–5.81)
RDW: 13.6 % (ref 11.5–15.5)
WBC: 9.7 10*3/uL (ref 4.0–10.5)

## 2014-05-08 LAB — CBG MONITORING, ED
Glucose-Capillary: 130 mg/dL — ABNORMAL HIGH (ref 70–99)
Glucose-Capillary: 139 mg/dL — ABNORMAL HIGH (ref 70–99)
Glucose-Capillary: 182 mg/dL — ABNORMAL HIGH (ref 70–99)

## 2014-05-08 NOTE — ED Notes (Signed)
MD at bedside. 

## 2014-05-08 NOTE — ED Notes (Signed)
Bed: RESA Expected date:  Expected time:  Means of arrival:  Comments: Hypoglycemia, d50, narcan

## 2014-05-08 NOTE — ED Provider Notes (Signed)
CSN: [id number]     Arrival date & time 05/08/14  1049 History   First MD Initiated Contact with Patient 05/08/14 1105     Chief Complaint  Patient presents with  . Hypoglycemia    HPI Patient presents emergent after an episode of hypoglycemia. Patient states he was feeling well last night. The only thing different that he did use he took a pain pill that he was recently prescribed. The patient murmurs going to bed the next thing he remembers is being awakened by his wife.  The patient had his blood sugar taken at home. It was 57. EMS found him to be unresponsive. He was given an amp of D. 50 and 1/2 mg of Narcan. The patient had complete resolution of his symptoms.  Patient has an insulin pump. That was discontinued. The patient is now alert and awake. He denies any complaints. He denies chest pain, shortness of breath, abdominal pain, numbness or weakness . Past Medical History  Diagnosis Date  . Obesity   . DM retinopathy   . Unspecified venous (peripheral) insufficiency   . Anemia in chronic kidney disease(285.21)   . Unspecified vitamin D deficiency   . CAD (coronary artery disease) 10/15/2013  . Chronic kidney disease (CKD), stage IV (severe)   . BPH (benign prostatic hypertrophy) 10/15/2013  . Chronic systolic CHF (congestive heart failure)   . Hypertensive heart disease   . History of constrictive pericarditis   . Hyperlipidemia   . Shortness of breath     "recently w/lying down and w/exertion" (10/15/2013)  . DM (diabetes mellitus) type II controlled with renal manifestation   . Spondylitis, cervical   . Arthritis     "hands and feet" (10/15/2013)   Past Surgical History  Procedure Laterality Date  . Pericardiectomy      2000  . Inguinal hernia repair  1980's  . Cardiac catheterization  2005  . Cataract extraction w/ intraocular lens  implant, bilateral Bilateral ?2000's   No family history on file. History  Substance Use Topics  . Smoking status: Never Smoker   . Smokeless  tobacco: Never Used  . Alcohol Use: No    Review of Systems  All other systems reviewed and are negative.     Allergies  Contrast media  Home Medications   Prior to Admission medications   Medication Sig Start Date End Date Taking? Authorizing Provider  aspirin EC 81 MG tablet Take 81 mg by mouth daily.   Yes Historical Provider, MD  carvedilol (COREG) 6.25 MG tablet Take 6.25 mg by mouth 2 (two) times daily with a meal.   Yes Historical Provider, MD  clobetasol cream (TEMOVATE) 0.05 % Apply 1 application topically at bedtime as needed (for rash).    Yes Historical Provider, MD  clopidogrel (PLAVIX) 75 MG tablet Take 75 mg by mouth daily with breakfast.   Yes Historical Provider, MD  finasteride (PROSCAR) 5 MG tablet Take 5 mg by mouth daily.   Yes Historical Provider, MD  furosemide (LASIX) 80 MG tablet Take 1 tablet (80 mg total) by mouth daily. 10/19/13  Yes Othella Boyer, MD  gabapentin (NEURONTIN) 300 MG capsule Take 300 mg by mouth 3 (three) times daily.  03/15/14  Yes Historical Provider, MD  insulin aspart (NOVOLOG) 100 UNIT/ML injection Inject 14 Units into the skin 3 (three) times daily before meals. Novolog 100 units/ml.  For use in insulin pump.   Yes Historical Provider, MD  isosorbide dinitrate (ISORDIL) 20 MG tablet Take  1 tablet (20 mg total) by mouth 2 (two) times daily. 10/19/13  Yes Othella Boyer, MD  meclizine (ANTIVERT) 25 MG tablet Take 25 mg by mouth 3 (three) times daily as needed for dizziness.    Yes Historical Provider, MD  nitroGLYCERIN (NITROSTAT) 0.4 MG SL tablet Place 0.4 mg under the tongue every 5 (five) minutes as needed for chest pain.   Yes Historical Provider, MD  pregabalin (LYRICA) 50 MG capsule Take 1 capsule (50 mg total) by mouth 2 (two) times daily. 10/19/13  Yes Othella Boyer, MD  simvastatin (ZOCOR) 40 MG tablet Take 40 mg by mouth at bedtime.   Yes Historical Provider, MD  tamsulosin (FLOMAX) 0.4 MG CAPS capsule Take 0.4 mg by mouth  daily.   Yes Historical Provider, MD  triamcinolone cream (KENALOG) 0.1 % Apply 1 application topically at bedtime as needed (for rash).    Yes Historical Provider, MD   BP 145/75  Pulse 74  Temp(Src) 97.9 F (36.6 C) (Oral)  Resp 17  Ht  (1.753 m)  Wt 223 lb (101.152 kg)  BMI 32.92 kg/m2  SpO2 93% Physical Exam  Nursing note and vitals reviewed. Constitutional: He appears well-developed and well-nourished. No distress.  Obese  HENT:  Head: Normocephalic and atraumatic.  Right Ear: External ear normal.  Left Ear: External ear normal.  Eyes: Conjunctivae are normal. Right eye exhibits no discharge. Left eye exhibits no discharge. No scleral icterus.  Neck: Neck supple. No tracheal deviation present.  Cardiovascular: Normal rate, regular rhythm and intact distal pulses.   Pulmonary/Chest: Effort normal and breath sounds normal. No stridor. No respiratory distress. He has no wheezes. He has no rales.  Abdominal: Soft. Bowel sounds are normal. He exhibits no distension. There is no tenderness. There is no rebound and no guarding.  Musculoskeletal: He exhibits no edema and no tenderness.  Neurological: He is alert. He has normal strength. No cranial nerve deficit (no facial droop, extraocular movements intact, no slurred speech) or sensory deficit. He exhibits normal muscle tone. He displays no seizure activity. Coordination normal.  Skin: Skin is warm and dry. No rash noted.  Psychiatric: He has a normal mood and affect.    ED Course  Procedures (including critical care time) Labs Review Labs Reviewed  CBC - Abnormal; Notable for the following:    RBC 3.94 (*)    Hemoglobin 12.7 (*)    MCV 100.8 (*)    All other components within normal limits  COMPREHENSIVE METABOLIC PANEL - Abnormal; Notable for the following:    Glucose, Bld 174 (*)    BUN 27 (*)    Creatinine, Ser 1.88 (*)    Albumin 3.4 (*)    GFR calc non Af Amer 32 (*)    GFR calc Af Amer 37 (*)    All other  components within normal limits  CBG MONITORING, ED - Abnormal; Notable for the following:    Glucose-Capillary 130 (*)    All other components within normal limits  CBG MONITORING, ED - Abnormal; Notable for the following:    Glucose-Capillary 139 (*)    All other components within normal limits  CBG MONITORING, ED - Abnormal; Notable for the following:    Glucose-Capillary 182 (*)    All other components within normal limits  CBG MONITORING, ED      MDM   Final diagnoses:  Hypoglycemia    The patient was given a meal in the emergency department. He was monitored for  several hours. His blood sugar being stable.  I'm not certain as to the cause of his hypoglycemia. It is possible that she slept later than usual because of the pain medications that he took. This may have contributed to his morning hypoglycemia.  At this time there does not appear to be any evidence of an acute emergency medical condition and the patient appears stable for discharge with appropriate outpatient follow up.     Linwood Dibbles, MD 05/08/14 [phone]

## 2014-05-08 NOTE — ED Notes (Signed)
Pt from home via [facility] c/o hypoglycemia. Upon [facility] arrival CBG was 57 and patient was unresponsive. Amp of D50 given and 0.5 of Narcan given. Pt became alert and oriented after administration of both. EMS suctioned airway and reports he had orange juice in his airway. Pt alert and oriented at this time with a CBG in the 130's. Pt had an insulin pump attached when EMS arrived it was detached and is now at the bedside. 18g IV RT AC

## 2014-05-08 NOTE — Discharge Instructions (Signed)
Hypoglycemia °Hypoglycemia occurs when the glucose in your blood is too low. Glucose is a type of sugar that is your body's main energy source. Hormones, such as insulin and glucagon, control the level of glucose in the blood. Insulin lowers blood glucose and glucagon increases blood glucose. Having too much insulin in your blood stream, or not eating enough food containing sugar, can result in hypoglycemia. Hypoglycemia can happen to people with or without diabetes. It can develop quickly and can be a medical emergency.  °CAUSES  °· Missing or delaying meals. °· Not eating enough carbohydrates at meals. °· Taking too much diabetes medicine. °· Not timing your oral diabetes medicine or insulin doses with meals, snacks, and exercise. °· Nausea and vomiting. °· Certain medicines. °· Severe illnesses, such as hepatitis, kidney disorders, and certain eating disorders. °· Increased activity or exercise without eating something extra or adjusting medicines. °· Drinking too much alcohol. °· A nerve disorder that affects body functions like your heart rate, blood pressure, and digestion (autonomic neuropathy). °· A condition where the stomach muscles do not function properly (gastroparesis). Therefore, medicines and food may not absorb properly. °· Rarely, a tumor of the pancreas can produce too much insulin. °SYMPTOMS  °· Hunger. °· Sweating (diaphoresis). °· Change in body temperature. °· Shakiness. °· Headache. °· Anxiety. °· Lightheadedness. °· Irritability. °· Difficulty concentrating. °· Dry mouth. °· Tingling or numbness in the hands or feet. °· Restless sleep or sleep disturbances. °· Altered speech and coordination. °· Change in mental status. °· Seizures or prolonged convulsions. °· Combativeness. °· Drowsiness (lethargic). °· Weakness. °· Increased heart rate or palpitations. °· Confusion. °· Pale, gray skin color. °· Blurred or double vision. °· Fainting. °DIAGNOSIS  °A physical exam and medical history will be  performed. Your caregiver may make a diagnosis based on your symptoms. Blood tests and other lab tests may be performed to confirm a diagnosis. Once the diagnosis is made, your caregiver will see if your signs and symptoms go away once your blood glucose is raised.  °TREATMENT  °Usually, you can easily treat your hypoglycemia when you notice symptoms. °· Check your blood glucose. If it is less than 70 mg/dl, take one of the following:   °¨ 3-4 glucose tablets.   °¨ ½ cup juice.   °¨ ½ cup regular soda.   °¨ 1 cup skim milk.   °¨ ½-1 tube of glucose gel.   °¨ 5-6 hard candies.   °· Avoid high-fat drinks or food that may delay a rise in blood glucose levels. °· Do not take more than the recommended amount of sugary foods, drinks, gel, or tablets. Doing so will cause your blood glucose to go too high.   °· Wait 10-15 minutes and recheck your blood glucose. If it is still less than 70 mg/dl or below your target range, repeat treatment.   °· Eat a snack if it is more than 1 hour until your next meal.   °There may be a time when your blood glucose may go so low that you are unable to treat yourself at home when you start to notice symptoms. You may need someone to help you. You may even faint or be unable to swallow. If you cannot treat yourself, someone will need to bring you to the hospital.  °HOME CARE INSTRUCTIONS °· If you have diabetes, follow your diabetes management plan by: °¨ Taking your medicines as directed. °¨ Following your exercise plan. °¨ Following your meal plan. Do not skip meals. Eat on time. °¨ Testing your blood   glucose regularly. Check your blood glucose before and after exercise. If you exercise longer or different than usual, be sure to check blood glucose more frequently. °¨ Wearing your medical alert jewelry that says you have diabetes. °· Identify the cause of your hypoglycemia. Then, develop ways to prevent the recurrence of hypoglycemia. °· Do not take a hot bath or shower right after an  insulin shot. °· Always carry treatment with you. Glucose tablets are the easiest to carry. °· If you are going to drink alcohol, drink it only with meals. °· Tell friends or family members ways to keep you safe during a seizure. This may include removing hard or sharp objects from the area or turning you on your side. °· Maintain a healthy weight. °SEEK MEDICAL CARE IF:  °· You are having problems keeping your blood glucose in your target range. °· You are having frequent episodes of hypoglycemia. °· You feel you might be having side effects from your medicines. °· You are not sure why your blood glucose is dropping so low. °· You notice a change in vision or a new problem with your vision. °SEEK IMMEDIATE MEDICAL CARE IF:  °· Confusion develops. °· A change in mental status occurs. °· The inability to swallow develops. °· Fainting occurs. °Document Released: 08/26/2005 Document Revised: 08/31/2013 Document Reviewed: 12/23/2011 °[employer]® Patient Information ©2015 [employer]. This information is not intended to replace advice given to you by your health care provider. Make sure you discuss any questions you have with your health care provider. ° °

## 2014-05-08 NOTE — ED Notes (Signed)
Pt wheeled to car.  

## 2014-07-08 ENCOUNTER — Other Ambulatory Visit: Payer: Self-pay | Admitting: Internal Medicine

## 2014-07-08 DIAGNOSIS — R1084 Generalized abdominal pain: Secondary | ICD-10-CM

## 2014-07-08 DIAGNOSIS — R634 Abnormal weight loss: Secondary | ICD-10-CM

## 2014-07-08 DIAGNOSIS — R61 Generalized hyperhidrosis: Secondary | ICD-10-CM

## 2014-07-13 ENCOUNTER — Ambulatory Visit
Admission: RE | Admit: 2014-07-13 | Discharge: 2014-07-13 | Disposition: A | Discharge status: Home / Self Care | Payer: Medicare Other | Source: Ambulatory Visit | Attending: Internal Medicine | Admitting: Internal Medicine

## 2014-07-13 DIAGNOSIS — R1084 Generalized abdominal pain: Secondary | ICD-10-CM

## 2014-07-13 DIAGNOSIS — R634 Abnormal weight loss: Secondary | ICD-10-CM

## 2014-07-13 DIAGNOSIS — R61 Generalized hyperhidrosis: Secondary | ICD-10-CM

## 2014-10-29 ENCOUNTER — Inpatient Hospital Stay (HOSPITAL_COMMUNITY)
Admission: EM | Admit: 2014-10-29 | Discharge: 2014-11-08 | DRG: 637 | Disposition: E | Payer: Medicare Other | Attending: Internal Medicine | Admitting: Internal Medicine

## 2014-10-29 ENCOUNTER — Encounter (HOSPITAL_COMMUNITY): Payer: Self-pay | Admitting: Emergency Medicine

## 2014-10-29 ENCOUNTER — Emergency Department (HOSPITAL_COMMUNITY): Payer: Medicare Other

## 2014-10-29 DIAGNOSIS — E876 Hypokalemia: Secondary | ICD-10-CM | POA: Diagnosis not present

## 2014-10-29 DIAGNOSIS — I5020 Unspecified systolic (congestive) heart failure: Secondary | ICD-10-CM

## 2014-10-29 DIAGNOSIS — N400 Enlarged prostate without lower urinary tract symptoms: Secondary | ICD-10-CM | POA: Diagnosis present

## 2014-10-29 DIAGNOSIS — I255 Ischemic cardiomyopathy: Secondary | ICD-10-CM | POA: Diagnosis present

## 2014-10-29 DIAGNOSIS — Z66 Do not resuscitate: Secondary | ICD-10-CM | POA: Diagnosis not present

## 2014-10-29 DIAGNOSIS — Z794 Long term (current) use of insulin: Secondary | ICD-10-CM

## 2014-10-29 DIAGNOSIS — Z7902 Long term (current) use of antithrombotics/antiplatelets: Secondary | ICD-10-CM

## 2014-10-29 DIAGNOSIS — J690 Pneumonitis due to inhalation of food and vomit: Secondary | ICD-10-CM | POA: Diagnosis not present

## 2014-10-29 DIAGNOSIS — E785 Hyperlipidemia, unspecified: Secondary | ICD-10-CM | POA: Diagnosis present

## 2014-10-29 DIAGNOSIS — I214 Non-ST elevation (NSTEMI) myocardial infarction: Secondary | ICD-10-CM | POA: Diagnosis present

## 2014-10-29 DIAGNOSIS — E875 Hyperkalemia: Secondary | ICD-10-CM | POA: Diagnosis present

## 2014-10-29 DIAGNOSIS — E559 Vitamin D deficiency, unspecified: Secondary | ICD-10-CM | POA: Diagnosis present

## 2014-10-29 DIAGNOSIS — E669 Obesity, unspecified: Secondary | ICD-10-CM | POA: Diagnosis present

## 2014-10-29 DIAGNOSIS — R739 Hyperglycemia, unspecified: Secondary | ICD-10-CM

## 2014-10-29 DIAGNOSIS — E860 Dehydration: Secondary | ICD-10-CM | POA: Diagnosis present

## 2014-10-29 DIAGNOSIS — I5023 Acute on chronic systolic (congestive) heart failure: Secondary | ICD-10-CM | POA: Diagnosis present

## 2014-10-29 DIAGNOSIS — I872 Venous insufficiency (chronic) (peripheral): Secondary | ICD-10-CM | POA: Diagnosis present

## 2014-10-29 DIAGNOSIS — E11319 Type 2 diabetes mellitus with unspecified diabetic retinopathy without macular edema: Secondary | ICD-10-CM | POA: Diagnosis present

## 2014-10-29 DIAGNOSIS — R17 Unspecified jaundice: Secondary | ICD-10-CM | POA: Diagnosis present

## 2014-10-29 DIAGNOSIS — N184 Chronic kidney disease, stage 4 (severe): Secondary | ICD-10-CM | POA: Diagnosis present

## 2014-10-29 DIAGNOSIS — E1310 Other specified diabetes mellitus with ketoacidosis without coma: Secondary | ICD-10-CM | POA: Diagnosis present

## 2014-10-29 DIAGNOSIS — J9601 Acute respiratory failure with hypoxia: Secondary | ICD-10-CM | POA: Insufficient documentation

## 2014-10-29 DIAGNOSIS — R570 Cardiogenic shock: Secondary | ICD-10-CM | POA: Diagnosis present

## 2014-10-29 DIAGNOSIS — Z978 Presence of other specified devices: Secondary | ICD-10-CM

## 2014-10-29 DIAGNOSIS — R579 Shock, unspecified: Secondary | ICD-10-CM | POA: Diagnosis not present

## 2014-10-29 DIAGNOSIS — E873 Alkalosis: Secondary | ICD-10-CM | POA: Diagnosis not present

## 2014-10-29 DIAGNOSIS — E1159 Type 2 diabetes mellitus with other circulatory complications: Secondary | ICD-10-CM | POA: Diagnosis present

## 2014-10-29 DIAGNOSIS — R140 Abdominal distension (gaseous): Secondary | ICD-10-CM | POA: Insufficient documentation

## 2014-10-29 DIAGNOSIS — I2510 Atherosclerotic heart disease of native coronary artery without angina pectoris: Secondary | ICD-10-CM | POA: Diagnosis present

## 2014-10-29 DIAGNOSIS — E1122 Type 2 diabetes mellitus with diabetic chronic kidney disease: Secondary | ICD-10-CM | POA: Diagnosis present

## 2014-10-29 DIAGNOSIS — R4182 Altered mental status, unspecified: Secondary | ICD-10-CM

## 2014-10-29 DIAGNOSIS — I130 Hypertensive heart and chronic kidney disease with heart failure and stage 1 through stage 4 chronic kidney disease, or unspecified chronic kidney disease: Secondary | ICD-10-CM | POA: Diagnosis present

## 2014-10-29 DIAGNOSIS — E871 Hypo-osmolality and hyponatremia: Secondary | ICD-10-CM | POA: Diagnosis present

## 2014-10-29 DIAGNOSIS — Z7982 Long term (current) use of aspirin: Secondary | ICD-10-CM

## 2014-10-29 DIAGNOSIS — E1140 Type 2 diabetes mellitus with diabetic neuropathy, unspecified: Secondary | ICD-10-CM | POA: Diagnosis present

## 2014-10-29 DIAGNOSIS — N179 Acute kidney failure, unspecified: Secondary | ICD-10-CM | POA: Diagnosis present

## 2014-10-29 DIAGNOSIS — Z79899 Other long term (current) drug therapy: Secondary | ICD-10-CM | POA: Diagnosis not present

## 2014-10-29 DIAGNOSIS — D631 Anemia in chronic kidney disease: Secondary | ICD-10-CM | POA: Diagnosis present

## 2014-10-29 DIAGNOSIS — G92 Toxic encephalopathy: Secondary | ICD-10-CM | POA: Diagnosis present

## 2014-10-29 DIAGNOSIS — E1110 Type 2 diabetes mellitus with ketoacidosis without coma: Secondary | ICD-10-CM

## 2014-10-29 DIAGNOSIS — IMO0002 Reserved for concepts with insufficient information to code with codable children: Secondary | ICD-10-CM | POA: Diagnosis present

## 2014-10-29 DIAGNOSIS — J9690 Respiratory failure, unspecified, unspecified whether with hypoxia or hypercapnia: Secondary | ICD-10-CM

## 2014-10-29 DIAGNOSIS — J9600 Acute respiratory failure, unspecified whether with hypoxia or hypercapnia: Secondary | ICD-10-CM | POA: Insufficient documentation

## 2014-10-29 DIAGNOSIS — I5021 Acute systolic (congestive) heart failure: Secondary | ICD-10-CM | POA: Diagnosis present

## 2014-10-29 LAB — LACTIC ACID, PLASMA
Lactic Acid, Venous: 6.2 mmol/L (ref 0.5–2.0)
Lactic Acid, Venous: 6.9 mmol/L (ref 0.5–2.0)

## 2014-10-29 LAB — URINALYSIS, ROUTINE W REFLEX MICROSCOPIC
Bilirubin Urine: NEGATIVE
Glucose, UA: >1000 mg/dL — AB
HGB URINE DIPSTICK: NEGATIVE
KETONES UR: 40 mg/dL — AB
Leukocytes, UA: NEGATIVE
Nitrite: NEGATIVE
PROTEIN: NEGATIVE mg/dL
Specific Gravity, Urine: 1.024 (ref 1.005–1.030)
UROBILINOGEN UA: 0.2 mg/dL (ref 0.0–1.0)
pH: 5 (ref 5.0–8.0)

## 2014-10-29 LAB — CBC WITH DIFFERENTIAL/PLATELET
BASOS ABS: 0 10*3/uL (ref 0.0–0.1)
Basophils Relative: 0 % (ref 0–1)
Eosinophils Absolute: 0 10*3/uL (ref 0.0–0.7)
Eosinophils Relative: 0 % (ref 0–5)
HCT: 37.5 % — ABNORMAL LOW (ref 39.0–52.0)
Hemoglobin: 11.3 g/dL — ABNORMAL LOW (ref 13.0–17.0)
Lymphocytes Relative: 13 % (ref 12–46)
Lymphs Abs: 3 10*3/uL (ref 0.7–4.0)
MCH: 33.6 pg (ref 26.0–34.0)
MCHC: 30.1 g/dL (ref 30.0–36.0)
MCV: 111.6 fL — ABNORMAL HIGH (ref 78.0–100.0)
MONO ABS: 1.8 10*3/uL — AB (ref 0.1–1.0)
Monocytes Relative: 8 % (ref 3–12)
NEUTROS ABS: 18.1 10*3/uL — AB (ref 1.7–7.7)
Neutrophils Relative %: 79 % — ABNORMAL HIGH (ref 43–77)
PLATELETS: 322 10*3/uL (ref 150–400)
RBC: 3.36 MIL/uL — ABNORMAL LOW (ref 4.22–5.81)
RDW: 13.6 % (ref 11.5–15.5)
WBC: 22.9 10*3/uL — ABNORMAL HIGH (ref 4.0–10.5)

## 2014-10-29 LAB — BLOOD GAS, ARTERIAL
Acid-base deficit: 22.2 mmol/L — ABNORMAL HIGH (ref 0.0–2.0)
Bicarbonate: 6 mEq/L — ABNORMAL LOW (ref 20.0–24.0)
Drawn by: 308601
FIO2: 1 %
LHR: 24 {breaths}/min
MECHVT: 640 mL
O2 Saturation: 99 %
PCO2 ART: 19.4 mmHg — AB (ref 35.0–45.0)
PEEP: 5 cmH2O
Patient temperature: 98.6
TCO2: 5.9 mmol/L (ref 0–100)
pH, Arterial: 7.12 — CL (ref 7.350–7.450)
pO2, Arterial: 507 mmHg — ABNORMAL HIGH (ref 80.0–100.0)

## 2014-10-29 LAB — BLOOD GAS, VENOUS
Acid-base deficit: 23.8 mmol/L — ABNORMAL HIGH (ref 0.0–2.0)
Bicarbonate: 5.5 mEq/L — ABNORMAL LOW (ref 20.0–24.0)
O2 Saturation: 88.6 %
PCO2 VEN: 20.4 mmHg — AB (ref 45.0–50.0)
PH VEN: 7.062 — AB (ref 7.250–7.300)
Patient temperature: 98.6
TCO2: 5.5 mmol/L (ref 0–100)
pO2, Ven: 73.1 mmHg — ABNORMAL HIGH (ref 30.0–45.0)

## 2014-10-29 LAB — CBG MONITORING, ED: Glucose-Capillary: >600 mg/dL (ref 70–99)

## 2014-10-29 LAB — LIPASE, BLOOD: Lipase: 15 U/L (ref 11–59)

## 2014-10-29 LAB — URINE MICROSCOPIC-ADD ON

## 2014-10-29 LAB — BRAIN NATRIURETIC PEPTIDE: B Natriuretic Peptide: 1034.7 pg/mL — ABNORMAL HIGH (ref 0.0–100.0)

## 2014-10-29 LAB — I-STAT TROPONIN, ED: TROPONIN I, POC: 1.25 ng/mL — AB (ref 0.00–0.08)

## 2014-10-29 MED ORDER — SUCCINYLCHOLINE CHLORIDE 20 MG/ML IJ SOLN
INTRAMUSCULAR | Status: AC
  Filled 2014-10-29: qty 1

## 2014-10-29 MED ORDER — PHENYLEPHRINE HCL 10 MG/ML IJ SOLN
100.0000 ug/min | Freq: Once | INTRAVENOUS | Status: DC
  Filled 2014-10-29: qty 1

## 2014-10-29 MED ORDER — SODIUM CHLORIDE 0.9 % IV SOLN
INTRAVENOUS | Status: DC
  Administered 2014-10-29: 5.4 [IU]/h via INTRAVENOUS
  Administered 2014-10-30: 15.9 [IU]/h via INTRAVENOUS
  Administered 2014-10-30: 11:00:00 via INTRAVENOUS
  Filled 2014-10-29 (×3): qty 2.5

## 2014-10-29 MED ORDER — PHENYLEPHRINE 200 MCG/ML (NON-ED) FOR PRIAPISM
100.0000 ug | Freq: Once | INTRAMUSCULAR | Status: DC
  Filled 2014-10-29: qty 10

## 2014-10-29 MED ORDER — SODIUM CHLORIDE 0.9 % IV SOLN
1.0000 g | Freq: Once | INTRAVENOUS | Status: AC
  Administered 2014-10-29: 1 g via INTRAVENOUS
  Filled 2014-10-29: qty 10

## 2014-10-29 MED ORDER — PHENYLEPHRINE 200 MCG/ML FOR PRIAPISM / HYPOTENSION: 100.0000 ug | Freq: Once | INTRAMUSCULAR | Status: DC

## 2014-10-29 MED ORDER — LIDOCAINE HCL (CARDIAC) 20 MG/ML IV SOLN
INTRAVENOUS | Status: AC
  Filled 2014-10-29: qty 5

## 2014-10-29 MED ORDER — NOREPINEPHRINE BITARTRATE 1 MG/ML IV SOLN
2.0000 ug/min | INTRAVENOUS | Status: DC
  Administered 2014-10-30: 25 ug/min via INTRAVENOUS
  Filled 2014-10-29: qty 16

## 2014-10-29 MED ORDER — ROCURONIUM BROMIDE 50 MG/5ML IV SOLN
INTRAVENOUS | Status: AC
  Administered 2014-10-29: 100 mg
  Filled 2014-10-29: qty 2

## 2014-10-29 MED ORDER — SODIUM BICARBONATE 8.4 % IV SOLN
50.0000 meq | Freq: Once | INTRAVENOUS | Status: AC
  Administered 2014-10-29: 50 meq via INTRAVENOUS
  Filled 2014-10-29: qty 50

## 2014-10-29 MED ORDER — SODIUM CHLORIDE 0.9 % IV SOLN: 1.0000 g | Freq: Once | INTRAVENOUS | Status: DC

## 2014-10-29 MED ORDER — ETOMIDATE 2 MG/ML IV SOLN
INTRAVENOUS | Status: AC
  Administered 2014-10-29: 20 mg
  Filled 2014-10-29: qty 20

## 2014-10-29 MED ORDER — DEXTROSE-NACL 5-0.45 % IV SOLN: INTRAVENOUS | Status: DC

## 2014-10-29 MED ORDER — SODIUM CHLORIDE 0.9 % IV BOLUS (SEPSIS)
500.0000 mL | Freq: Once | INTRAVENOUS | Status: AC
  Administered 2014-10-29: 500 mL via INTRAVENOUS

## 2014-10-29 MED ORDER — DEXTROSE 5 % IV SOLN
2.0000 ug/min | INTRAVENOUS | Status: DC
  Administered 2014-10-29: 5 ug/min via INTRAVENOUS
  Filled 2014-10-29: qty 4

## 2014-10-29 MED ORDER — SODIUM CHLORIDE 0.9 % IV BOLUS (SEPSIS)
1000.0000 mL | Freq: Once | INTRAVENOUS | Status: AC
  Administered 2014-10-29: 1000 mL via INTRAVENOUS

## 2014-10-29 MED ORDER — SODIUM CHLORIDE 0.9 % IV BOLUS (SEPSIS): 1000.0000 mL | Freq: Once | INTRAVENOUS | Status: DC

## 2014-10-29 NOTE — ED Notes (Signed)
Patient hypotensive Dr. Littie DeedsGentry aware Per MD, bolus to be given--[facility]

## 2014-10-29 NOTE — ED Notes (Signed)
Dr. Littie DeedsGentry at bedside to place CVL

## 2014-10-29 NOTE — ED Notes (Signed)
Patient status d/w Dr. Littie DeedsGentry Per MD, family to be contacted regarding code status Sherrilyn RistKari, RN to get in touch with spouse

## 2014-10-29 NOTE — ED Notes (Signed)
Nurse currently drawing labs 

## 2014-10-29 NOTE — ED Notes (Signed)
Dr. Littie DeedsGentry aware of manual BP of 80/40, 500 NS bolus ordered.  Also informed Dr. Littie DeedsGentry of pt's labored breathing as well as accessory muscle use.

## 2014-10-29 NOTE — ED Notes (Addendum)
RSI initiated at 2136:  Etomidate 20 mg given at 2137 Rocuronium 100 mg given at 2137  7.5 mm ET tube placed by Dr. Littie DeedsGentry and noted to be 25 cm at the teeth Positive color change via capnography  Order for portable CXR placed and radiology called

## 2014-10-29 NOTE — H&P (Signed)
PULMONARY / CRITICAL CARE MEDICINE HISTORY AND PHYSICAL EXAMINATION   Name: Brandon Evans MRN: [id number] DOB: 03/16/1930    ADMISSION DATE:  Aug 03, 2015  PRIMARY SERVICE: PCCM  CHIEF COMPLAINT:  Elevated blood glucose and abdominal distention.  BRIEF PATIENT DESCRIPTION: [age] M with innumerable medical issues presenting with AMS, shock, elevated troponins all in the setting of DKA.   SIGNIFICANT EVENTS / STUDIES:  Intubated 2/20  LINES / TUBES: L IJ 2/20 ETT 2/20  CULTURES: Blood cultures x 2, 2/20 Urine Culture, 2/20 Tracheal Aspirate, 2/20  ANTIBIOTICS: Vanc, 2/20 - Zosyn, 2/20 -  HISTORY OF PRESENT ILLNESS: Brandon Evans is an 89 M with multiple medical issues including CAD, ICM (EF not in EPIC), remote constrictive pericarditis s/p pericardecotmy, HTN, DM, CKD who [facility] with abdominal distention x 3 weeks and 1 week of elevated blood sugars. Brandon Evans is currently intubated and sedated and unable to provide any history. Attempts to reach his wife, Corrie DandyMary, have been unsuccessful [phone]).    PAST MEDICAL HISTORY :  Past Medical History  Diagnosis Date  . Obesity   . DM retinopathy   . Unspecified venous (peripheral) insufficiency   . Anemia in chronic kidney disease(285.21)   . Unspecified vitamin D deficiency   . CAD (coronary artery disease) 10/15/2013  . Chronic kidney disease (CKD), stage IV (severe)   . BPH (benign prostatic hypertrophy) 10/15/2013  . Chronic systolic CHF (congestive heart failure)   . Hypertensive heart disease   . History of constrictive pericarditis   . Hyperlipidemia   . Shortness of breath     "recently w/lying down and w/exertion" (10/15/2013)  . DM (diabetes mellitus) type II controlled with renal manifestation   . Spondylitis, cervical   . Arthritis     "hands and feet" (10/15/2013)   Past Surgical History  Procedure Laterality Date  . Pericardiectomy      2000  . Inguinal hernia repair  1980's  . Cardiac  catheterization  2005  . Cataract extraction w/ intraocular lens  implant, bilateral Bilateral ?2000's   Prior to Admission medications   Medication Sig Start Date End Date Taking? Authorizing Provider  aspirin EC 81 MG tablet Take 81 mg by mouth daily.    Historical Provider, MD  carvedilol (COREG) 6.25 MG tablet Take 6.25 mg by mouth 2 (two) times daily with a meal.    Historical Provider, MD  clobetasol cream (TEMOVATE) 0.05 % Apply 1 application topically at bedtime as needed (for rash).     Historical Provider, MD  clopidogrel (PLAVIX) 75 MG tablet Take 75 mg by mouth daily with breakfast.    Historical Provider, MD  finasteride (PROSCAR) 5 MG tablet Take 5 mg by mouth daily.    Historical Provider, MD  furosemide (LASIX) 80 MG tablet Take 1 tablet (80 mg total) by mouth daily. 10/19/13   Othella BoyerWilliam S Tilley, MD  gabapentin (NEURONTIN) 300 MG capsule Take 300 mg by mouth 3 (three) times daily.  03/15/14   Historical Provider, MD  insulin aspart (NOVOLOG) 100 UNIT/ML injection Inject 14 Units into the skin 3 (three) times daily before meals. Novolog 100 units/ml.  For use in insulin pump.    Historical Provider, MD  isosorbide dinitrate (ISORDIL) 20 MG tablet Take 1 tablet (20 mg total) by mouth 2 (two) times daily. 10/19/13   Othella BoyerWilliam S Tilley, MD  meclizine (ANTIVERT) 25 MG tablet Take 25 mg by mouth 3 (three) times daily as needed for dizziness.  Historical Provider, MD  nitroGLYCERIN (NITROSTAT) 0.4 MG SL tablet Place 0.4 mg under the tongue every 5 (five) minutes as needed for chest pain.    Historical Provider, MD  pregabalin (LYRICA) 50 MG capsule Take 1 capsule (50 mg total) by mouth 2 (two) times daily. 10/19/13   Othella Boyer, MD  simvastatin (ZOCOR) 40 MG tablet Take 40 mg by mouth at bedtime.    Historical Provider, MD  tamsulosin (FLOMAX) 0.4 MG CAPS capsule Take 0.4 mg by mouth daily.    Historical Provider, MD  triamcinolone cream (KENALOG) 0.1 % Apply 1 application topically at  bedtime as needed (for rash).     Historical Provider, MD   Allergies  Allergen Reactions  . Contrast Media [Iodinated Diagnostic Agents] Hives    FAMILY HISTORY:  History reviewed. No pertinent family history. SOCIAL HISTORY:  reports that he has never smoked. He has never used smokeless tobacco. He reports that he does not drink alcohol or use illicit drugs.  REVIEW OF SYSTEMS:  Unable to obtain secondary to patient condition.  SUBJECTIVE:   VITAL SIGNS: Temp:  [97.8 F (36.6 C)] 97.8 F (36.6 C) (02/20 1846) Pulse Rate:  [30-100] 97 (02/20 2210) Resp:  [0-27] 13 (02/20 2210) BP: (56-126)/(27-86) 123/40 mmHg (02/20 2210) SpO2:  [72 %-100 %] 100 % (02/20 2210) FiO2 (%):  [100 %] 100 % (02/20 2140) HEMODYNAMICS:   VENTILATOR SETTINGS: Vent Mode:  [-] PRVC FiO2 (%):  [100 %] 100 % Set Rate:  [24 bmp] 24 bmp Vt Set:  [520 mL-640 mL] 640 mL PEEP:  [5 cmH20] 5 cmH20 Plateau Pressure:  [18 cmH20] 18 cmH20 INTAKE / OUTPUT: Intake/Output    None     PHYSICAL EXAMINATION: General:  Elderly M, Intubated Neuro:  Not actively sedated, non-responsive HEENT:  Sclera anicteric, conjunctiva pink, MMM, ETT present Neck: Trachea supple and midline, (-) LAN or JVD Cardiovascular:  RRR,  NS1/S2, (-) MRG Lungs:  Coarse mechanical BS bilaterally Abdomen:  S/NT/ND/(+)BS Musculoskeletal:  1+ pitting edema to shins bilaterally Skin:  Chronic PAD changes in LE bilaterally, stage 1 decube spanning most of perianal region, up to 10 cm  LABS:  CBC  Recent Labs Lab 2014-11-13 1856  WBC 22.9*  HGB 11.3*  HCT 37.5*  PLT 322   Coag's No results for input(s): APTT, INR in the last 168 hours. BMET  Recent Labs Lab Nov 13, 2014 1856  NA 131*  K 7.8*  CL 98  CO2 5*  BUN 60*  CREATININE 3.46*  GLUCOSE 930*   Electrolytes  Recent Labs Lab 11/13/14 1856  CALCIUM 8.5   Sepsis Markers  Recent Labs Lab 2014-11-13 1858  LATICACIDVEN 6.2*   ABG No results for input(s): PHART,  PCO2ART, PO2ART in the last 168 hours. Liver Enzymes  Recent Labs Lab 2014/11/13 1856  AST 44*  ALT 25  ALKPHOS 66  BILITOT 2.0*  ALBUMIN 3.7   Cardiac Enzymes No results for input(s): TROPONINI, PROBNP in the last 168 hours. Glucose  Recent Labs Lab 2014/11/13 1839 2014-11-13 2005 11-13-14 2056  GLUCAP >600* >600* >600*    Imaging Dg Chest Port 1 View  2014/11/13   CLINICAL DATA:  Initial evaluation for altered mental status, abdominal distention for 3 weeks, hyperglycemia  EXAM: PORTABLE CHEST - 1 VIEW  COMPARISON:  10/16/2013  FINDINGS: Stable moderate enlargement of cardiac silhouette. Patient is status post median sternotomy. Vascular pattern normal. Mild bibasilar opacity most consistent with atelectasis.  IMPRESSION: Mild bilateral lower lobe atelectasis.  Electronically Signed   By: Esperanza Heir M.D.   On: 10/25/2014 19:17    EKG: Inferolateral ST depressions.  CXR: R directed ETT. Slightly deep CVC.  ASSESSMENT / PLAN:  Active Problems:   Multiple organ system failure   PULMONARY A: Respiratory Failure: Per RN handoff, patient intubated for worsening hypoxemia and fatigue. Likely unable to maintain compensation for acidosis.  P:   Lung protective ventilation SBT/WUA  CARDIOVASCULAR A: Shock: Unclear etiology. No obvious infectious source. Extremity temp not suggestive of cardiogenic. ? Hypovolemic. Presumed NSTEMI:  CAD ICM HTN P:   Aggressive IVF Pressors to maintain MAP >= 65 Check cortisol Echo Check SvO2 Serial Lactates Serial Trops Asa, Heparin Drip Cards consult called as [facility] but doubt patient is candidate for any intervention Hold antihypertensives  RENAL A: AKI on CKD: Unclear if patient making urine. Non charted from [facility]. Urine in foley bag light yellow; it may have been emptied before he was sent over from [facility]. May need HD to control K.  Hyperkalemia: Fortunately, no peaking on EKG.  Hyponatremia: Likely 2/2 dehyrdation P:    Immediate BMP IVF Serial BMPs  GASTROINTESTINAL A: Elevated Bilirubin: Suspect 2/2 hemoconcentration. Abdominal Distention: Benign exam. P:   Monitor CMPs KUB CT when more stable  HEMATOLOGIC A: Anemia: Likely AOCD  INFECTIOUS A: No Clear Infectious Source:  P:   Follow-up cultures Empiric Vanc/Zosyn for now  ENDOCRINE A: DKA:  DM:  P:   DKA insulin drip protocol  NEUROLOGIC A: AMS:  P:   Likely 2/2 metabolic derrangements and shock  Decubitous Ulcer: Wound Care  BEST PRACTICE / DISPOSITION Level of Care:  ICU Primary Service:  PCCM Consultants:  Cards Code Status:  Full Diet:  NPO DVT Px:  Heparin GTT GI Px:  PPI Skin Integrity:  Decub  Social / Family:  Unable to reach wife yet  TODAY'S SUMMARY:   I have personally obtained a history, examined the patient, evaluated laboratory and imaging results, formulated the assessment and plan and placed orders.  CRITICAL CARE: The patient is critically ill with multiple organ systems failure and requires high complexity decision making for assessment and support, frequent evaluation and titration of therapies, application of advanced monitoring technologies and extensive interpretation of multiple databases. Critical Care Time devoted to patient care services described in this note is 60 minutes.   Evalyn Casco, MD Pulmonary and Critical Care Medicine [facility] Pager: [phone]   10/12/2014, 10:20 PM

## 2014-10-29 NOTE — ED Notes (Signed)
Levo increased per Dr. Littie DeedsGentry, [facility]

## 2014-10-29 NOTE — ED Notes (Signed)
Dr. Littie DeedsGentry at bedside and aware of VS Orders to be placed

## 2014-10-29 NOTE — ED Notes (Signed)
2LNC applied 

## 2014-10-29 NOTE — ED Notes (Signed)
Portable CXR completed.

## 2014-10-29 NOTE — ED Notes (Signed)
Dr. Littie DeedsGentry at bedside for RSI

## 2014-10-29 NOTE — ED Notes (Signed)
Bed: RESB Expected date: Dec 29, 2014 Expected time: 6:27 PM Means of arrival: Ambulance Comments: AMS/hyperglycemia

## 2014-10-29 NOTE — ED Notes (Signed)
[facility] 81 [facility] with abdominal distention for 3 weeks and elevated blood sugar for approximately 1 week per observation of home glucometer by [facility].  Pt has been experiencing polyuria and abdominal distention; decreased activity in the past few past and the home CBG monitor has been reading above 500 mg/dl (HI) for past week.

## 2014-10-29 NOTE — ED Provider Notes (Addendum)
CSN: [id number]     Arrival date & time 10/17/2014  1836 History   First MD Initiated Contact with Patient 11/03/2014 1838     Chief Complaint  Patient presents with  . Bloated  . Hyperglycemia     (Consider location/radiation/quality/duration/timing/severity/associated sxs/prior Treatment) Patient is a 81 y.o. male presenting with altered mental status.  Altered Mental Status Presenting symptoms: combativeness and confusion   Severity:  Moderate Most recent episode:  Today Episode history:  Continuous Duration:  1 day Timing:  Constant Progression:  Unchanged Chronicity:  Recurrent Context: nursing home resident   Context: not a recent change in medication     Past Medical History  Diagnosis Date  . Obesity   . DM retinopathy   . Unspecified venous (peripheral) insufficiency   . Anemia in chronic kidney disease(285.21)   . Unspecified vitamin D deficiency   . CAD (coronary artery disease) 10/15/2013  . Chronic kidney disease (CKD), stage IV (severe)   . BPH (benign prostatic hypertrophy) 10/15/2013  . Chronic systolic CHF (congestive heart failure)   . Hypertensive heart disease   . History of constrictive pericarditis   . Hyperlipidemia   . Shortness of breath     "recently w/lying down and w/exertion" (10/15/2013)  . DM (diabetes mellitus) type II controlled with renal manifestation   . Spondylitis, cervical   . Arthritis     "hands and feet" (10/15/2013)   Past Surgical History  Procedure Laterality Date  . Pericardiectomy      2000  . Inguinal hernia repair  1980's  . Cardiac catheterization  2005  . Cataract extraction w/ intraocular lens  implant, bilateral Bilateral ?2000's   History reviewed. No pertinent family history. History  Substance Use Topics  . Smoking status: Never Smoker   . Smokeless tobacco: Never Used  . Alcohol Use: No    Review of Systems  Psychiatric/Behavioral: Positive for confusion.      Allergies  Contrast media  Home  Medications   Prior to Admission medications   Medication Sig Start Date End Date Taking? Authorizing Provider  aspirin EC 81 MG tablet Take 81 mg by mouth daily.    Historical Provider, MD  carvedilol (COREG) 6.25 MG tablet Take 6.25 mg by mouth 2 (two) times daily with a meal.    Historical Provider, MD  clobetasol cream (TEMOVATE) 0.05 % Apply 1 application topically at bedtime as needed (for rash).     Historical Provider, MD  clopidogrel (PLAVIX) 75 MG tablet Take 75 mg by mouth daily with breakfast.    Historical Provider, MD  finasteride (PROSCAR) 5 MG tablet Take 5 mg by mouth daily.    Historical Provider, MD  furosemide (LASIX) 80 MG tablet Take 1 tablet (80 mg total) by mouth daily. 10/19/13   Othella Boyer, MD  gabapentin (NEURONTIN) 300 MG capsule Take 300 mg by mouth 3 (three) times daily.  03/15/14   Historical Provider, MD  insulin aspart (NOVOLOG) 100 UNIT/ML injection Inject 14 Units into the skin 3 (three) times daily before meals. Novolog 100 units/ml.  For use in insulin pump.    Historical Provider, MD  isosorbide dinitrate (ISORDIL) 20 MG tablet Take 1 tablet (20 mg total) by mouth 2 (two) times daily. 10/19/13   Othella Boyer, MD  meclizine (ANTIVERT) 25 MG tablet Take 25 mg by mouth 3 (three) times daily as needed for dizziness.     Historical Provider, MD  nitroGLYCERIN (NITROSTAT) 0.4 MG SL tablet Place 0.4 mg  under the tongue every 5 (five) minutes as needed for chest pain.    Historical Provider, MD  pregabalin (LYRICA) 50 MG capsule Take 1 capsule (50 mg total) by mouth 2 (two) times daily. 10/19/13   Othella BoyerWilliam S Tilley, MD  simvastatin (ZOCOR) 40 MG tablet Take 40 mg by mouth at bedtime.    Historical Provider, MD  tamsulosin (FLOMAX) 0.4 MG CAPS capsule Take 0.4 mg by mouth daily.    Historical Provider, MD  triamcinolone cream (KENALOG) 0.1 % Apply 1 application topically at bedtime as needed (for rash).     Historical Provider, MD   BP 127/48 mmHg  Pulse 101   Temp(Src) 97.8 F (36.6 C) (Oral)  Resp 18  Ht 6\' 1"  (1.854 m)  SpO2 100% Physical Exam  Constitutional: He appears well-developed and well-nourished.  HENT:  Head: Normocephalic and atraumatic.  Eyes: Conjunctivae and EOM are normal.  Neck: Normal range of motion. Neck supple.  Cardiovascular: Normal rate, regular rhythm and normal heart sounds.   Pulmonary/Chest: Effort normal and breath sounds normal. No respiratory distress.  Abdominal: He exhibits distension. There is generalized tenderness. There is no rebound and no guarding.  Musculoskeletal: Normal range of motion.  Neurological: He is alert. He has normal strength and normal reflexes. No cranial nerve deficit or sensory deficit. GCS eye subscore is 4. GCS verbal subscore is 4. GCS motor subscore is 6.  Skin: Skin is warm and dry.  Vitals reviewed.   ED Course  CENTRAL LINE Date/Time: 12/16/14 10:54 PM Performed by: Mirian MoGENTRY, MATTHEW Authorized by: Mirian MoGENTRY, MATTHEW Consent: The procedure was performed in an emergent situation. Time out: Immediately prior to procedure a "time out" was called to verify the correct patient, procedure, equipment, support staff and site/side marked as required. Indications: vascular access Anesthesia: see MAR for details Preparation: skin prepped with 2% chlorhexidine Location details: left internal jugular Patient position: Trendelenburg Catheter type: triple lumen Pre-procedure: landmarks identified Ultrasound guidance: yes Sterile ultrasound techniques: sterile gel and sterile probe covers were used Number of attempts: 1 Successful placement: yes Post-procedure: dressing applied and line sutured Assessment: blood return through all ports,  free fluid flow,  no pneumothorax on x-ray and placement verified by x-ray Patient tolerance: Patient tolerated the procedure well with no immediate complications  INTUBATION Date/Time: 12/16/14 10:54 PM Performed by: Mirian MoGENTRY, MATTHEW Authorized  by: Mirian MoGENTRY, MATTHEW Consent: The procedure was performed in an emergent situation. Intubation method: video-assisted Patient status: paralyzed (RSI) Preoxygenation: nonrebreather mask Sedatives: etomidate Paralytic: rocuronium Laryngoscope size: Mac 3 Tube size: 7.5 mm Tube type: cuffed Number of attempts: 1 Cricoid pressure: no Cords visualized: yes Post-procedure assessment: chest rise Breath sounds: equal Cuff inflated: yes ETT to teeth: 25 cm Tube secured with: ETT holder Chest x-ray interpreted by me, other physician and radiologist. Chest x-ray findings: endotracheal tube in appropriate position Patient tolerance: Patient tolerated the procedure well with no immediate complications   (including critical care time) Labs Review Labs Reviewed  CBC WITH DIFFERENTIAL/PLATELET - Abnormal; Notable for the following:    WBC 22.9 (*)    RBC 3.36 (*)    Hemoglobin 11.3 (*)    HCT 37.5 (*)    MCV 111.6 (*)    Neutrophils Relative % 79 (*)    Neutro Abs 18.1 (*)    Monocytes Absolute 1.8 (*)    All other components within normal limits  COMPREHENSIVE METABOLIC PANEL - Abnormal; Notable for the following:    Sodium 131 (*)    Potassium 7.8 (*)  CO2 5 (*)    Glucose, Bld 930 (*)    BUN 60 (*)    Creatinine, Ser 3.46 (*)    AST 44 (*)    Total Bilirubin 2.0 (*)    GFR calc non Af Amer 15 (*)    GFR calc Af Amer 17 (*)    Anion gap 28 (*)    All other components within normal limits  URINALYSIS, ROUTINE W REFLEX MICROSCOPIC - Abnormal; Notable for the following:    Glucose, UA >1000 (*)    Ketones, ur 40 (*)    All other components within normal limits  LACTIC ACID, PLASMA - Abnormal; Notable for the following:    Lactic Acid, Venous 6.2 (*)    All other components within normal limits  LACTIC ACID, PLASMA - Abnormal; Notable for the following:    Lactic Acid, Venous 6.9 (*)    All other components within normal limits  BLOOD GAS, VENOUS - Abnormal; Notable for the  following:    pH, Ven 7.062 (*)    pCO2, Ven 20.4 (*)    pO2, Ven 73.1 (*)    Bicarbonate 5.5 (*)    Acid-base deficit 23.8 (*)    All other components within normal limits  BRAIN NATRIURETIC PEPTIDE - Abnormal; Notable for the following:    B Natriuretic Peptide 1034.7 (*)    All other components within normal limits  BLOOD GAS, ARTERIAL - Abnormal; Notable for the following:    pH, Arterial 7.120 (*)    pCO2 arterial 19.4 (*)    pO2, Arterial 507.0 (*)    Bicarbonate 6.0 (*)    Acid-base deficit 22.2 (*)    All other components within normal limits  CBG MONITORING, ED - Abnormal; Notable for the following:    Glucose-Capillary >600 (*)    All other components within normal limits  CBG MONITORING, ED - Abnormal; Notable for the following:    Glucose-Capillary >600 (*)    All other components within normal limits  CBG MONITORING, ED - Abnormal; Notable for the following:    Glucose-Capillary >600 (*)    All other components within normal limits  I-STAT TROPOININ, ED - Abnormal; Notable for the following:    Troponin i, poc 1.25 (*)    All other components within normal limits  URINE CULTURE  LIPASE, BLOOD  URINE MICROSCOPIC-ADD ON  URINALYSIS, ROUTINE W REFLEX MICROSCOPIC  CBG MONITORING, ED  CBG MONITORING, ED    Imaging Review Dg Chest Portable 1 View  11/07/14   CLINICAL DATA:  Hyperglycemia.  Intubated.  EXAM: PORTABLE CHEST - 1 VIEW  COMPARISON:  November 07, 2014 at 19:04  FINDINGS: Endotracheal tube is between the clavicular heads and the carina. Nasogastric tube extends into the stomach and off the inferior edge of the image. Left jugular central line extends into the low SVC.  There is no pneumothorax. There is moderate unchanged cardiomegaly. Mild basilar opacities are present, left greater than right, unchanged.  IMPRESSION: Line and tubes as described. No pneumothorax. Unchanged basilar opacities.   Electronically Signed   By: Ellery Plunk M.D.   On: 11-07-2014  22:30   Dg Chest Port 1 View  2014-11-07   CLINICAL DATA:  Initial evaluation for altered mental status, abdominal distention for 3 weeks, hyperglycemia  EXAM: PORTABLE CHEST - 1 VIEW  COMPARISON:  10/16/2013  FINDINGS: Stable moderate enlargement of cardiac silhouette. Patient is status post median sternotomy. Vascular pattern normal. Mild bibasilar opacity most consistent with atelectasis.  IMPRESSION:  Mild bilateral lower lobe atelectasis.   Electronically Signed   By: Esperanza Heir M.D.   On: 11-22-14 19:17     EKG Interpretation   Date/Time:  Saturday Nov 22, 2014 20:24:48 EST Ventricular Rate:  83 PR Interval:    QRS Duration: 148 QT Interval:  457 QTC Calculation: 537 R Axis:   -136 Text Interpretation:  Accelerated junctional rhythm Nonspecific  intraventricular conduction delay Borderline ST depression, anterolateral  leads Confirmed by Mirian Mo [phone]) on 11/22/14 8:32:43 PM       CRITICAL CARE Performed by: Mirian Mo   Total critical care time: 82 min  Critical care time was exclusive of separately billable procedures and treating other patients.  Critical care was necessary to treat or prevent imminent or life-threatening deterioration.  Critical care was time spent personally by me on the following activities: development of treatment plan with patient and/or surrogate as well as nursing, discussions with consultants, evaluation of patient's response to treatment, examination of patient, obtaining history from patient or surrogate, ordering and performing treatments and interventions, ordering and review of laboratory studies, ordering and review of radiographic studies, pulse oximetry and re-evaluation of patient's condition.   MDM   Final diagnoses:  Multiple organ system failure  Acute respiratory failure with hypoxemia  Diabetic ketoacidosis without coma associated with type 2 diabetes mellitus    81 y.o. male with pertinent PMH of DM,  CAD, sCHF presents with altered mental statuts described as confusion and combativeness over the last day.  He was hyperglycemic prior to arrival, with EMS reading high. On arrival today vitals signs and physical exam as above. Patient has no focal neurodeficits. Oriented 2. I attempted to contact the pt's wife numerous times, and police sent.  To all knowledge, the pt is full code.   Wu as above with dka, severe acidosis, multi organ system failure.  No signs of infection.  EKG with nonspecific findings, however concerning for possible cardiac pathology, however most consistent with global ischemia.  Consulted cardiology who will follow along, however given overall clinical scenario, do not feel active intervention warranted at this time.  Levophed started for BP.  Intubated and central line placed.  [facility] to [facility], Dr. Sherene Sires accepting  I have reviewed all laboratory and imaging studies if ordered as above  1. Multiple organ system failure   2. Hyperglycemia   3. Bloated abdomen   4. Acute respiratory failure with hypoxemia   5. Diabetic ketoacidosis without coma associated with type 2 diabetes mellitus         Mirian Mo, MD Nov 22, 2014 6045  Mirian Mo, MD 11-22-2014 2259

## 2014-10-29 NOTE — Consult Note (Signed)
Reason for Consult: NSTEMI Referring Physician: Dr. Colin Rhein Primary Cardiologist: Dr. Kateri Plummer is an 81 y.o. male.  HPI: Brandon Evans is an 81 yo m an with PMH of T2DM, HTN, CKD stage III/IV, previous pericarditis, systolic heart failure with last known EF ~ 45% with presumed CAD based on lexiscan myoview deonstrating inferolateral ischemia who presents today with altered mental status and combativeness from his nursing home. Cardiology consulted given diffuse ST depression on ECG and elevated troponin in setting of metabolic disarray and DKA. Multiple attempts have been made to discuss/speak with his wife but no success thus far. Further history not able to be obtained 2/2 critical illness of Brandon Evans.   Past Medical History  Diagnosis Date  . Obesity   . DM retinopathy   . Unspecified venous (peripheral) insufficiency   . Anemia in chronic kidney disease(285.21)   . Unspecified vitamin D deficiency   . CAD (coronary artery disease) 10/15/2013  . Chronic kidney disease (CKD), stage IV (severe)   . BPH (benign prostatic hypertrophy) 10/15/2013  . Chronic systolic CHF (congestive heart failure)   . Hypertensive heart disease   . History of constrictive pericarditis   . Hyperlipidemia   . Shortness of breath     "recently w/lying down and w/exertion" (10/15/2013)  . DM (diabetes mellitus) type II controlled with renal manifestation   . Spondylitis, cervical   . Arthritis     "hands and feet" (10/15/2013)    Past Surgical History  Procedure Laterality Date  . Pericardiectomy      2000  . Inguinal hernia repair  1980's  . Cardiac catheterization  2005  . Cataract extraction w/ intraocular lens  implant, bilateral Bilateral ?2000's    History reviewed. No pertinent family history.  Social History:  reports that he has never smoked. He has never used smokeless tobacco. He reports that he does not drink alcohol or use illicit drugs.  Allergies:  Allergies  Allergen  Reactions  . Contrast Media [Iodinated Diagnostic Agents] Hives    Medications:  I have reviewed the patient's current medications. Prior to Admission:  Prescriptions prior to admission  Medication Sig Dispense Refill Last Dose  . aspirin EC 81 MG tablet Take 81 mg by mouth daily.   05/07/2014 at Unknown time  . carvedilol (COREG) 6.25 MG tablet Take 6.25 mg by mouth 2 (two) times daily with a meal.   05/07/2014 at 1000  . clobetasol cream (TEMOVATE) 8.29 % Apply 1 application topically at bedtime as needed (for rash).    05/07/2014 at Unknown time  . clopidogrel (PLAVIX) 75 MG tablet Take 75 mg by mouth daily with breakfast.   05/07/2014 at Unknown time  . finasteride (PROSCAR) 5 MG tablet Take 5 mg by mouth daily.   05/07/2014 at Unknown time  . furosemide (LASIX) 80 MG tablet Take 1 tablet (80 mg total) by mouth daily. 60 tablet 12 05/07/2014 at Unknown time  . gabapentin (NEURONTIN) 300 MG capsule Take 300 mg by mouth 3 (three) times daily.    05/07/2014 at Unknown time  . insulin aspart (NOVOLOG) 100 UNIT/ML injection Inject 14 Units into the skin 3 (three) times daily before meals. Novolog 100 units/ml.  For use in insulin pump.   05/08/2014 at Unknown time  . isosorbide dinitrate (ISORDIL) 20 MG tablet Take 1 tablet (20 mg total) by mouth 2 (two) times daily. 60 tablet 12 05/07/2014 at Unknown time  . meclizine (ANTIVERT) 25 MG tablet Take 25 mg  by mouth 3 (three) times daily as needed for dizziness.    05/07/2014 at Unknown time  . nitroGLYCERIN (NITROSTAT) 0.4 MG SL tablet Place 0.4 mg under the tongue every 5 (five) minutes as needed for chest pain.   unknown  . pregabalin (LYRICA) 50 MG capsule Take 1 capsule (50 mg total) by mouth 2 (two) times daily.   05/07/2014 at Unknown time  . simvastatin (ZOCOR) 40 MG tablet Take 40 mg by mouth at bedtime.   05/07/2014 at Unknown time  . tamsulosin (FLOMAX) 0.4 MG CAPS capsule Take 0.4 mg by mouth daily.   05/07/2014 at Unknown time  . triamcinolone  cream (KENALOG) 0.1 % Apply 1 application topically at bedtime as needed (for rash).    Past Week at Unknown time   Scheduled: . antiseptic oral rinse  7 mL Mouth Rinse QID  . aspirin EC  81 mg Oral Daily  . chlorhexidine  15 mL Mouth Rinse BID  . clopidogrel  75 mg Oral Q breakfast  . finasteride  5 mg Oral Daily  . gabapentin  300 mg Oral TID  . lidocaine (cardiac) 100 mg/59ml      . phenylephrine 200 mcg / ml (NON-ED USE ONLY) injection  100 mcg Intracavernosal Once  . piperacillin-tazobactam (ZOSYN)  IV  2.25 g Intravenous 3 times per day  . pregabalin  50 mg Oral BID  . simvastatin  40 mg Oral QHS  . succinylcholine      . tamsulosin  0.4 mg Oral Daily  . vancomycin  1,500 mg Intravenous Q48H    Results for orders placed or performed during the hospital encounter of 10/20/2014 (from the past 48 hour(s))  CBG monitoring, ED     Status: Abnormal   Collection Time: 10/12/2014  6:39 PM  Result Value Ref Range   Glucose-Capillary >600 (HH) 70 - 99 mg/dL  Blood gas, venous     Status: Abnormal   Collection Time: 11/04/2014  6:53 PM  Result Value Ref Range   pH, Ven 7.062 (LL) 7.250 - 7.300    Comment: CRITICAL RESULT CALLED TO, READ BACK BY AND VERIFIED WITH: TIM SMITH,RN AT 1900 ON 10/26/2014 BY T.BURGESS,RRT,RCP    pCO2, Ven 20.4 (L) 45.0 - 50.0 mmHg   pO2, Ven 73.1 (H) 30.0 - 45.0 mmHg   Bicarbonate 5.5 (L) 20.0 - 24.0 mEq/L   TCO2 5.5 0 - 100 mmol/L   Acid-base deficit 23.8 (H) 0.0 - 2.0 mmol/L   O2 Saturation 88.6 %   Patient temperature 98.6    Collection site VEIN    Drawn by COLLECTED BY NURSE    Sample type VENOUS   CBC with Differential     Status: Abnormal   Collection Time: 10/24/2014  6:56 PM  Result Value Ref Range   WBC 22.9 (H) 4.0 - 10.5 K/uL   RBC 3.36 (L) 4.22 - 5.81 MIL/uL   Hemoglobin 11.3 (L) 13.0 - 17.0 g/dL   HCT 37.5 (L) 39.0 - 52.0 %   MCV 111.6 (H) 78.0 - 100.0 fL   MCH 33.6 26.0 - 34.0 pg   MCHC 30.1 30.0 - 36.0 g/dL   RDW 13.6 11.5 - 15.5 %    Platelets 322 150 - 400 K/uL   Neutrophils Relative % 79 (H) 43 - 77 %   Lymphocytes Relative 13 12 - 46 %   Monocytes Relative 8 3 - 12 %   Eosinophils Relative 0 0 - 5 %   Basophils Relative 0 0 -  1 %   Neutro Abs 18.1 (H) 1.7 - 7.7 K/uL   Lymphs Abs 3.0 0.7 - 4.0 K/uL   Monocytes Absolute 1.8 (H) 0.1 - 1.0 K/uL   Eosinophils Absolute 0.0 0.0 - 0.7 K/uL   Basophils Absolute 0.0 0.0 - 0.1 K/uL   RBC Morphology CRENATED RBCs   Comprehensive metabolic panel     Status: Abnormal   Collection Time: 10/13/2014  6:56 PM  Result Value Ref Range   Sodium 131 (L) 135 - 145 mmol/L    Comment: REPEATED TO VERIFY   Potassium 7.8 (HH) 3.5 - 5.1 mmol/L    Comment: CRITICAL RESULT CALLED TO, READ BACK BY AND VERIFIED WITH: SPOKE WITH GENTRY,M DR [id number] COVINGTON,N REPEATED TO VERIFY    Chloride 98 96 - 112 mmol/L    Comment: REPEATED TO VERIFY   CO2 5 (LL) 19 - 32 mmol/L    Comment: CRITICAL RESULT CALLED TO, READ BACK BY AND VERIFIED WITH: SPOKE WITH GENTRY,M DR [id number] COVINGTON,N    Glucose, Bld 930 (HH) 70 - 99 mg/dL    Comment: CRITICAL RESULT CALLED TO, READ BACK BY AND VERIFIED WITH: SPOKE WITH GENTRY,M DR 2038 [id number] COVINGTON,N    BUN 60 (H) 6 - 23 mg/dL   Creatinine, Ser 3.46 (H) 0.50 - 1.35 mg/dL   Calcium 8.5 8.4 - 10.5 mg/dL   Total Protein 6.5 6.0 - 8.3 g/dL   Albumin 3.7 3.5 - 5.2 g/dL   AST 44 (H) 0 - 37 U/L   ALT 25 0 - 53 U/L   Alkaline Phosphatase 66 39 - 117 U/L   Total Bilirubin 2.0 (H) 0.3 - 1.2 mg/dL   GFR calc non Af Amer 15 (L) >90 mL/min   GFR calc Af Amer 17 (L) >90 mL/min    Comment: (NOTE) The eGFR has been calculated using the CKD EPI equation. This calculation has not been validated in all clinical situations. eGFR's persistently <90 mL/min signify possible Chronic Kidney Disease.    Anion gap 28 (H) 5 - 15    Comment: REPEATED TO VERIFY  Lipase, blood     Status: None   Collection Time: 10/24/2014  6:56 PM  Result Value Ref Range   Lipase  15 11 - 59 U/L  Urinalysis, Routine w reflex microscopic     Status: Abnormal   Collection Time: 10/16/2014  6:57 PM  Result Value Ref Range   Color, Urine YELLOW YELLOW   APPearance CLEAR CLEAR   Specific Gravity, Urine 1.024 1.005 - 1.030   pH 5.0 5.0 - 8.0   Glucose, UA >1000 (A) NEGATIVE mg/dL   Hgb urine dipstick NEGATIVE NEGATIVE   Bilirubin Urine NEGATIVE NEGATIVE   Ketones, ur 40 (A) NEGATIVE mg/dL   Protein, ur NEGATIVE NEGATIVE mg/dL   Urobilinogen, UA 0.2 0.0 - 1.0 mg/dL   Nitrite NEGATIVE NEGATIVE   Leukocytes, UA NEGATIVE NEGATIVE  Urine microscopic-add on     Status: None   Collection Time: 11/02/2014  6:57 PM  Result Value Ref Range   WBC, UA 0-2 <3 WBC/hpf  Lactic acid, plasma     Status: Abnormal   Collection Time: 10/26/2014  6:58 PM  Result Value Ref Range   Lactic Acid, Venous 6.2 (HH) 0.5 - 2.0 mmol/L    Comment: CRITICAL RESULT CALLED TO, READ BACK BY AND VERIFIED WITH: SPOKE WITH DUDLEY,F RN 2003 10/17/2014 COVINGTON,N   POC CBG, ED     Status: Abnormal   Collection Time: 11/03/2014  8:05 PM  Result Value Ref Range   Glucose-Capillary >600 (HH) 70 - 99 mg/dL   Comment 1 Notify RN    Comment 2 Document in Chart   Brain natriuretic peptide     Status: Abnormal   Collection Time: 10/20/2014  8:14 PM  Result Value Ref Range   B Natriuretic Peptide 1034.7 (H) 0.0 - 100.0 pg/mL  I-Stat Troponin, ED (not at [facility])     Status: Abnormal   Collection Time: 10/23/2014  8:43 PM  Result Value Ref Range   Troponin i, poc 1.25 (HH) 0.00 - 0.08 ng/mL   Comment NOTIFIED PHYSICIAN    Comment 3            Comment: Due to the release kinetics of cTnI, a negative result within the first hours of the onset of symptoms does not rule out myocardial infarction with certainty. If myocardial infarction is still suspected, repeat the test at appropriate intervals.   POC CBG, ED     Status: Abnormal   Collection Time: 10/21/2014  8:56 PM  Result Value Ref Range   Glucose-Capillary >600  (HH) 70 - 99 mg/dL   Comment 1 Notify RN    Comment 2 Document in Chart   Lactic acid, plasma     Status: Abnormal   Collection Time: 11/04/2014  9:19 PM  Result Value Ref Range   Lactic Acid, Venous 6.9 (HH) 0.5 - 2.0 mmol/L    Comment: REPEATED TO VERIFY CRITICAL RESULT CALLED TO, READ BACK BY AND VERIFIED WITH: DUDLEY,F/ED $[email or web address]'[email or web address]$  ON 10/20/2014 BY KARCZEWSKI,S.   POC CBG, ED     Status: Abnormal   Collection Time: 10/15/2014 10:28 PM  Result Value Ref Range   Glucose-Capillary >600 (HH) 70 - 99 mg/dL  Blood gas, arterial     Status: Abnormal   Collection Time: 11/03/2014 10:30 PM  Result Value Ref Range   FIO2 1.00 %   Delivery systems VENTILATOR    Mode PRESSURE REGULATED VOLUME CONTROL    VT 640 mL   Rate 24 resp/min   Peep/cpap 5.0 cm H20   pH, Arterial 7.120 (LL) 7.350 - 7.450    Comment: CRITICAL RESULT CALLED TO, READ BACK BY AND VERIFIED WITH:  KARI WARD, RN AT 2240 BY JESSICA NEUGENT,RRT,RCP ON 10/22/2014    pCO2 arterial 19.4 (LL) 35.0 - 45.0 mmHg    Comment: CRITICAL RESULT CALLED TO, READ BACK BY AND VERIFIED WITH:  KARI WARD, RN AT 2240 BY JESSICA NEUGENT,RRT,RCP ON 10/10/2014    pO2, Arterial 507.0 (H) 80.0 - 100.0 mmHg   Bicarbonate 6.0 (L) 20.0 - 24.0 mEq/L   TCO2 5.9 0 - 100 mmol/L   Acid-base deficit 22.2 (H) 0.0 - 2.0 mmol/L   O2 Saturation 99.0 %   Patient temperature 98.6    Collection site LEFT BRACHIAL    Drawn by [id number]    Sample type ARTERIAL DRAW   Glucose, capillary     Status: Abnormal   Collection Time: 11/03/2014 11:54 PM  Result Value Ref Range   Glucose-Capillary >600 (HH) 70 - 99 mg/dL   Comment 1 Notify RN   Basic metabolic panel     Status: Abnormal   Collection Time: 10/30/14 12:10 AM  Result Value Ref Range   Sodium 134 (L) 135 - 145 mmol/L   Potassium 5.0 3.5 - 5.1 mmol/L   Chloride 101 96 - 112 mmol/L   CO2 10 (LL) 19 - 32 mmol/L    Comment: REPEATED TO VERIFY CRITICAL RESULT  CALLED TO, READ BACK BY AND VERIFIED WITH: HARDUCK G,RN 10/30/14  0047 WAYK    Glucose, Bld 798 (HH) 70 - 99 mg/dL    Comment: REPEATED TO VERIFY CRITICAL RESULT CALLED TO, READ BACK BY AND VERIFIED WITH: HARDUCK G,RN 10/30/14 0047 WAYK    BUN 60 (H) 6 - 23 mg/dL   Creatinine, Ser 4.12 (H) 0.50 - 1.35 mg/dL   Calcium 8.0 (L) 8.4 - 10.5 mg/dL   GFR calc non Af Amer 12 (L) >90 mL/min   GFR calc Af Amer 14 (L) >90 mL/min    Comment: (NOTE) The eGFR has been calculated using the CKD EPI equation. This calculation has not been validated in all clinical situations. eGFR's persistently <90 mL/min signify possible Chronic Kidney Disease.    Anion gap 23 (H) 5 - 15  Procalcitonin - Baseline     Status: None   Collection Time: 10/30/14 12:27 AM  Result Value Ref Range   Procalcitonin 12.67 ng/mL    Comment:        Interpretation: PCT >= 10 ng/mL: Important systemic inflammatory response, almost exclusively due to severe bacterial sepsis or septic shock. (NOTE)         ICU PCT Algorithm               Non ICU PCT Algorithm    ----------------------------     ------------------------------         PCT < 0.25 ng/mL                 PCT < 0.1 ng/mL     Stopping of antibiotics            Stopping of antibiotics       strongly encouraged.               strongly encouraged.    ----------------------------     ------------------------------       PCT level decrease by               PCT < 0.25 ng/mL       >= 80% from peak PCT       OR PCT 0.25 - 0.5 ng/mL          Stopping of antibiotics                                             encouraged.     Stopping of antibiotics           encouraged.    ----------------------------     ------------------------------       PCT level decrease by              PCT >= 0.25 ng/mL       < 80% from peak PCT        AND PCT >= 0.5 ng/mL             Continuing antibiotics                                              encouraged.       Continuing antibiotics            encouraged.    ----------------------------      ------------------------------  PCT level increase compared          PCT > 0.5 ng/mL         with peak PCT AND          PCT >= 0.5 ng/mL             Escalation of antibiotics                                          strongly encouraged.      Escalation of antibiotics        strongly encouraged.   Troponin I (q 6hr x 3)     Status: Abnormal   Collection Time: 10/30/14 12:30 AM  Result Value Ref Range   Troponin I 11.93 (HH) <0.031 ng/mL    Comment:        POSSIBLE MYOCARDIAL ISCHEMIA. SERIAL TESTING RECOMMENDED. REPEATED TO VERIFY CRITICAL VALUE NOTED.  VALUE IS CONSISTENT WITH PREVIOUSLY REPORTED AND CALLED VALUE. REPEATED TO VERIFY CRITICAL RESULT CALLED TO, READ BACK BY AND VERIFIED WITH: G.[facility] 6948 10/30/14 M.CAMPBELL   Lactic acid, plasma     Status: Abnormal   Collection Time: 10/30/14 12:34 AM  Result Value Ref Range   Lactic Acid, Venous 3.5 (HH) 0.5 - 2.0 mmol/L    Comment: REPEATED TO VERIFY CRITICAL RESULT CALLED TO, READ BACK BY AND VERIFIED WITH: C.HAYES,RN 0223 10/30/14 M.CAMPBELL   Blood gas, arterial     Status: Abnormal   Collection Time: 10/30/14  1:12 AM  Result Value Ref Range   FIO2 0.40 %   Delivery systems VENTILATOR    Mode PRESSURE REGULATED VOLUME CONTROL    VT 640 mL   Rate 28 resp/min   Peep/cpap 5.0 cm H20   pH, Arterial 7.186 (LL) 7.350 - 7.450    Comment: CRITICAL RESULT CALLED TO, READ BACK BY AND VERIFIED WITH: GREG HARDUK,RN AT 0120,BY Devantae BROWN RRT,RCP ON 10/30/2014    pCO2 arterial 20.3 (L) 35.0 - 45.0 mmHg   pO2, Arterial 144.0 (H) 80.0 - 100.0 mmHg   Bicarbonate 7.4 (L) 20.0 - 24.0 mEq/L   TCO2 8.0 0 - 100 mmol/L   Acid-base deficit 19.4 (H) 0.0 - 2.0 mmol/L   O2 Saturation 97.9 %   Patient temperature 98.6    Collection site A-LINE    Drawn by [id number]    Sample type ARTERIAL DRAW   Carboxyhemoglobin     Status: Abnormal   Collection Time: 10/30/14  1:18 AM  Result Value Ref Range   Total hemoglobin 10.0 (L) 13.5  - 18.0 g/dL   O2 Saturation 87.8 %   Carboxyhemoglobin 1.0 0.5 - 1.5 %   Methemoglobin 1.3 0.0 - 1.5 %  Blood gas, arterial     Status: Abnormal   Collection Time: 10/30/14  2:03 AM  Result Value Ref Range   FIO2 0.40 %   Delivery systems VENTILATOR    Mode PRESSURE REGULATED VOLUME CONTROL    VT 640 mL   Rate 34 resp/min   Peep/cpap 5.0 cm H20   pH, Arterial 7.239 (L) 7.350 - 7.450   pCO2 arterial 17.0 (LL) 35.0 - 45.0 mmHg    Comment: CRITICAL RESULT CALLED TO, READ BACK BY AND VERIFIED WITH: GREG HARDUK,RN AT 0214,BY Raygen BROWN RRT,RCP ON 10/30/2014    pO2, Arterial 159.0 (H) 80.0 - 100.0 mmHg   Bicarbonate 7.0 (L) 20.0 - 24.0 mEq/L  TCO2 7.5 0 - 100 mmol/L   Acid-base deficit 19.3 (H) 0.0 - 2.0 mmol/L   O2 Saturation 98.7 %   Patient temperature 98.6    Collection site A-LINE    Drawn by COLLECTED BY NURSE    Sample type ARTERIAL DRAW     Dg Chest Portable 1 View  10/17/2014   CLINICAL DATA:  Hyperglycemia.  Intubated.  EXAM: PORTABLE CHEST - 1 VIEW  COMPARISON:  10/30/2014 at 19:04  FINDINGS: Endotracheal tube is between the clavicular heads and the carina. Nasogastric tube extends into the stomach and off the inferior edge of the image. Left jugular central line extends into the low SVC.  There is no pneumothorax. There is moderate unchanged cardiomegaly. Mild basilar opacities are present, left greater than right, unchanged.  IMPRESSION: Line and tubes as described. No pneumothorax. Unchanged basilar opacities.   Electronically Signed   By: Andreas Newport M.D.   On: 10/28/2014 22:30   Dg Chest Port 1 View  10/17/2014   CLINICAL DATA:  Initial evaluation for altered mental status, abdominal distention for 3 weeks, hyperglycemia  EXAM: PORTABLE CHEST - 1 VIEW  COMPARISON:  10/16/2013  FINDINGS: Stable moderate enlargement of cardiac silhouette. Patient is status post median sternotomy. Vascular pattern normal. Mild bibasilar opacity most consistent with atelectasis.   IMPRESSION: Mild bilateral lower lobe atelectasis.   Electronically Signed   By: Skipper Cliche M.D.   On: 10/18/2014 19:17    Review of Systems  Unable to perform ROS: acuity of condition   Blood pressure 127/48, pulse 101, temperature 99.4 F (37.4 C), temperature source Oral, resp. rate 18, height $RemoveBe'6\' 1"'PAYkqBBck$  (1.854 m), weight 94.6 kg (208 lb 8.9 oz), SpO2 100 %. Physical Exam  Nursing note and vitals reviewed. Constitutional: He appears distressed.  Critically ill intubated  HENT:  ETT in place, right IJ in place  Neck: No JVD present.  Cardiovascular: Normal rate and regular rhythm.   Murmur heard. Soft systolic murmur at LSB  Respiratory:  Mechanical ventilation  GI: He exhibits distension. There is no tenderness.  Hypoactive BS  Musculoskeletal: He exhibits no edema or tenderness.  Neurological:  Intubated, sedated  Skin: He is diaphoretic.  Lukewarm to cool extremities   Labs reviewed; wbc 23, h/h 11.3/37.5, plt 322, na 131, K 7.8, bun/cr 60/3.5, bicarb 5, chloride 98, glucose 930 Chest x-ray: ETT in place, right IJ in place Lactate 6.9 Trop 1.25 --> 11.9 Ast/alt 44/25, BNP 1035 EKG: accelerated junctional, nonspecific IVCD, diffuse ST depression, elevated aVR Sinus rhythm, nonspecific IVCD, diffuse ST depression/elevated aVR  Assessment/Plan: Brandon Evans is an 81 yo m an with PMH of T2DM, HTN, CKD stage III/IV, systolic heart failure with last known EF ~ 45% with presumed CAD who presents with altered mental status and metabolic disarray. Differential diagnosis for DKA and metabolic disarray is broad including thyroid disease, sepsis/infection, MI, pancreatitis among other triggers/etiologies. His troponin has climbed significantly; however, he also has renal failure without clearance of troponin. For now agree with treatment for DKA, broad spectrum antibiotics and hemodynamic support. Continue to obtain history from family as able.  Problem List DKA/Metabolic  Disarray NSTEMI - Type I vs. Type II Elevated lactate Shock Presumed CAD with abnormal ECG Hypertension, T2DM, Dyslipidemia Acute on chronic kidney disease/renal failure Hyperkalemia Anion Gap Metabolic Acidosis  Systolic Heart Failure - For now, would repeat biomarkers, watch on telemetry, treat hyperkalemia/metabolic disarray - continue heparin gtt for now, daily aspirin - continue simvastatin 40 mg daily -  Update echocardiogram - based on trends, can consider functional study vs. Potential LHC based on renal function recovery  Brandon Evans 10/30/2014, 3:01 AM

## 2014-10-29 NOTE — ED Notes (Signed)
Notified Dr. Littie DeedsGentry of elevated troponin level.

## 2014-10-30 ENCOUNTER — Inpatient Hospital Stay (HOSPITAL_COMMUNITY): Payer: Medicare Other

## 2014-10-30 DIAGNOSIS — R140 Abdominal distension (gaseous): Secondary | ICD-10-CM

## 2014-10-30 DIAGNOSIS — I2510 Atherosclerotic heart disease of native coronary artery without angina pectoris: Secondary | ICD-10-CM

## 2014-10-30 DIAGNOSIS — I5021 Acute systolic (congestive) heart failure: Secondary | ICD-10-CM

## 2014-10-30 DIAGNOSIS — I25118 Atherosclerotic heart disease of native coronary artery with other forms of angina pectoris: Secondary | ICD-10-CM

## 2014-10-30 DIAGNOSIS — J9601 Acute respiratory failure with hypoxia: Secondary | ICD-10-CM | POA: Insufficient documentation

## 2014-10-30 DIAGNOSIS — I214 Non-ST elevation (NSTEMI) myocardial infarction: Secondary | ICD-10-CM

## 2014-10-30 DIAGNOSIS — R579 Shock, unspecified: Secondary | ICD-10-CM | POA: Diagnosis present

## 2014-10-30 LAB — CBC WITH DIFFERENTIAL/PLATELET
BASOS ABS: 0 10*3/uL (ref 0.0–0.1)
BASOS PCT: 0 % (ref 0–1)
EOS ABS: 0 10*3/uL (ref 0.0–0.7)
Eosinophils Relative: 0 % (ref 0–5)
HCT: 33.4 % — ABNORMAL LOW (ref 39.0–52.0)
HEMOGLOBIN: 10.8 g/dL — AB (ref 13.0–17.0)
LYMPHS ABS: 2.2 10*3/uL (ref 0.7–4.0)
Lymphocytes Relative: 12 % (ref 12–46)
MCH: 33.4 pg (ref 26.0–34.0)
MCHC: 32.3 g/dL (ref 30.0–36.0)
MCV: 103.4 fL — ABNORMAL HIGH (ref 78.0–100.0)
MONO ABS: 1 10*3/uL (ref 0.1–1.0)
Monocytes Relative: 5 % (ref 3–12)
Neutro Abs: 14.6 10*3/uL — ABNORMAL HIGH (ref 1.7–7.7)
Neutrophils Relative %: 83 % — ABNORMAL HIGH (ref 43–77)
Platelets: 263 10*3/uL (ref 150–400)
RBC: 3.23 MIL/uL — ABNORMAL LOW (ref 4.22–5.81)
RDW: 13.4 % (ref 11.5–15.5)
WBC: 17.8 10*3/uL — AB (ref 4.0–10.5)

## 2014-10-30 LAB — GLUCOSE, CAPILLARY
GLUCOSE-CAPILLARY: 463 mg/dL — AB (ref 70–99)
GLUCOSE-CAPILLARY: 234 mg/dL — AB (ref 70–99)
GLUCOSE-CAPILLARY: 226 mg/dL — AB (ref 70–99)
Glucose-Capillary: 267 mg/dL — ABNORMAL HIGH (ref 70–99)
Glucose-Capillary: 329 mg/dL — ABNORMAL HIGH (ref 70–99)
Glucose-Capillary: 372 mg/dL — ABNORMAL HIGH (ref 70–99)
Glucose-Capillary: 394 mg/dL — ABNORMAL HIGH (ref 70–99)
Glucose-Capillary: 463 mg/dL — ABNORMAL HIGH (ref 70–99)
Glucose-Capillary: 501 mg/dL — ABNORMAL HIGH (ref 70–99)
Glucose-Capillary: 561 mg/dL (ref 70–99)
Glucose-Capillary: 571 mg/dL (ref 70–99)
Glucose-Capillary: 584 mg/dL (ref 70–99)
Glucose-Capillary: >600 mg/dL (ref 70–99)
Glucose-Capillary: >600 mg/dL (ref 70–99)
Glucose-Capillary: >600 mg/dL (ref 70–99)

## 2014-10-30 LAB — URINALYSIS, ROUTINE W REFLEX MICROSCOPIC
Glucose, UA: >1000 mg/dL — AB
Ketones, ur: 15 mg/dL — AB
Leukocytes, UA: NEGATIVE
Nitrite: NEGATIVE
PH: 5 (ref 5.0–8.0)
PROTEIN: NEGATIVE mg/dL
Specific Gravity, Urine: 1.023 (ref 1.005–1.030)
Urobilinogen, UA: 1 mg/dL (ref 0.0–1.0)

## 2014-10-30 LAB — BLOOD GAS, ARTERIAL
ACID-BASE DEFICIT: 19.4 mmol/L — AB (ref 0.0–2.0)
ACID-BASE DEFICIT: 17.7 mmol/L — AB (ref 0.0–2.0)
Acid-base deficit: 19.3 mmol/L — ABNORMAL HIGH (ref 0.0–2.0)
BICARBONATE: 7 meq/L — AB (ref 20.0–24.0)
Bicarbonate: 7.4 mEq/L — ABNORMAL LOW (ref 20.0–24.0)
Bicarbonate: 8 mEq/L — ABNORMAL LOW (ref 20.0–24.0)
Drawn by: 31101
FIO2: 0.4 %
FIO2: 0.4 %
FIO2: 0.4 %
MECHVT: 640 mL
MECHVT: 640 mL
MECHVT: 640 mL
O2 SAT: 97.9 %
O2 Saturation: 98.7 %
O2 Saturation: 98.8 %
PATIENT TEMPERATURE: 98.6
PATIENT TEMPERATURE: 98.6
PCO2 ART: 20.3 mmHg — AB (ref 35.0–45.0)
PCO2 ART: 17 mmHg — AB (ref 35.0–45.0)
PCO2 ART: 17.4 mmHg — AB (ref 35.0–45.0)
PEEP/CPAP: 5 cmH2O
PEEP/CPAP: 5 cmH2O
PEEP: 5 cmH2O
PH ART: 7.186 — AB (ref 7.350–7.450)
Patient temperature: 98.6
RATE: 28 resp/min
RATE: 34 resp/min
RATE: 34 resp/min
TCO2: 8 mmol/L (ref 0–100)
TCO2: 7.5 mmol/L (ref 0–100)
TCO2: 8.5 mmol/L (ref 0–100)
pH, Arterial: 7.239 — ABNORMAL LOW (ref 7.350–7.450)
pH, Arterial: 7.284 — ABNORMAL LOW (ref 7.350–7.450)
pO2, Arterial: 144 mmHg — ABNORMAL HIGH (ref 80.0–100.0)
pO2, Arterial: 159 mmHg — ABNORMAL HIGH (ref 80.0–100.0)
pO2, Arterial: 186 mmHg — ABNORMAL HIGH (ref 80.0–100.0)

## 2014-10-30 LAB — BASIC METABOLIC PANEL
ANION GAP: 9 (ref 5–15)
Anion gap: 13 (ref 5–15)
Anion gap: 13 (ref 5–15)
Anion gap: 17 — ABNORMAL HIGH (ref 5–15)
Anion gap: 23 — ABNORMAL HIGH (ref 5–15)
Anion gap: 9 (ref 5–15)
BUN: 58 mg/dL — ABNORMAL HIGH (ref 6–23)
BUN: 60 mg/dL — ABNORMAL HIGH (ref 6–23)
BUN: 60 mg/dL — ABNORMAL HIGH (ref 6–23)
BUN: 61 mg/dL — AB (ref 6–23)
BUN: 61 mg/dL — ABNORMAL HIGH (ref 6–23)
BUN: 62 mg/dL — AB (ref 6–23)
CALCIUM: 8 mg/dL — AB (ref 8.4–10.5)
CHLORIDE: 101 mmol/L (ref 96–112)
CHLORIDE: 110 mmol/L (ref 96–112)
CO2: 10 mmol/L — AB (ref 19–32)
CO2: 10 mmol/L — CL (ref 19–32)
CO2: 13 mmol/L — ABNORMAL LOW (ref 19–32)
CO2: 14 mmol/L — AB (ref 19–32)
CO2: 14 mmol/L — ABNORMAL LOW (ref 19–32)
CO2: 15 mmol/L — ABNORMAL LOW (ref 19–32)
CREATININE: 3.68 mg/dL — AB (ref 0.50–1.35)
Calcium: 8 mg/dL — ABNORMAL LOW (ref 8.4–10.5)
Calcium: 8 mg/dL — ABNORMAL LOW (ref 8.4–10.5)
Calcium: 8.3 mg/dL — ABNORMAL LOW (ref 8.4–10.5)
Calcium: 8.2 mg/dL — ABNORMAL LOW (ref 8.4–10.5)
Calcium: 8 mg/dL — ABNORMAL LOW (ref 8.4–10.5)
Chloride: 107 mmol/L (ref 96–112)
Chloride: 109 mmol/L (ref 96–112)
Chloride: 114 mmol/L — ABNORMAL HIGH (ref 96–112)
Chloride: 115 mmol/L — ABNORMAL HIGH (ref 96–112)
Creatinine, Ser: 3.47 mg/dL — ABNORMAL HIGH (ref 0.50–1.35)
Creatinine, Ser: 3.52 mg/dL — ABNORMAL HIGH (ref 0.50–1.35)
Creatinine, Ser: 3.99 mg/dL — ABNORMAL HIGH (ref 0.50–1.35)
Creatinine, Ser: 4.06 mg/dL — ABNORMAL HIGH (ref 0.50–1.35)
Creatinine, Ser: 4.12 mg/dL — ABNORMAL HIGH (ref 0.50–1.35)
GFR calc Af Amer: 14 mL/min — ABNORMAL LOW (ref >90)
GFR calc Af Amer: 15 mL/min — ABNORMAL LOW (ref >90)
GFR calc Af Amer: 16 mL/min — ABNORMAL LOW (ref >90)
GFR calc Af Amer: 17 mL/min — ABNORMAL LOW (ref >90)
GFR calc Af Amer: 17 mL/min — ABNORMAL LOW (ref >90)
GFR calc non Af Amer: 12 mL/min — ABNORMAL LOW (ref >90)
GFR calc non Af Amer: 13 mL/min — ABNORMAL LOW (ref >90)
GFR calc non Af Amer: 14 mL/min — ABNORMAL LOW (ref >90)
GFR calc non Af Amer: 15 mL/min — ABNORMAL LOW (ref >90)
GFR, EST AFRICAN AMERICAN: 14 mL/min — AB (ref >90)
GFR, EST NON AFRICAN AMERICAN: 12 mL/min — AB (ref >90)
GFR, EST NON AFRICAN AMERICAN: 15 mL/min — AB (ref >90)
GLUCOSE: 198 mg/dL — AB (ref 70–99)
GLUCOSE: 442 mg/dL — AB (ref 70–99)
GLUCOSE: 798 mg/dL — AB (ref 70–99)
Glucose, Bld: 708 mg/dL (ref 70–99)
Glucose, Bld: 601 mg/dL (ref 70–99)
Glucose, Bld: 271 mg/dL — ABNORMAL HIGH (ref 70–99)
Potassium: 3.2 mmol/L — ABNORMAL LOW (ref 3.5–5.1)
Potassium: 3.4 mmol/L — ABNORMAL LOW (ref 3.5–5.1)
Potassium: 3.5 mmol/L (ref 3.5–5.1)
Potassium: 4 mmol/L (ref 3.5–5.1)
Potassium: 4.2 mmol/L (ref 3.5–5.1)
Potassium: 5 mmol/L (ref 3.5–5.1)
SODIUM: 134 mmol/L — AB (ref 135–145)
SODIUM: 134 mmol/L — AB (ref 135–145)
SODIUM: 137 mmol/L (ref 135–145)
SODIUM: 139 mmol/L (ref 135–145)
Sodium: 136 mmol/L (ref 135–145)
Sodium: 136 mmol/L (ref 135–145)

## 2014-10-30 LAB — HEPATIC FUNCTION PANEL
ALBUMIN: 2.9 g/dL — AB (ref 3.5–5.2)
ALT: 83 U/L — ABNORMAL HIGH (ref 0–53)
AST: 275 U/L — AB (ref 0–37)
Alkaline Phosphatase: 66 U/L (ref 39–117)
Bilirubin, Direct: 0.5 mg/dL (ref 0.0–0.5)
Indirect Bilirubin: 1.4 mg/dL — ABNORMAL HIGH (ref 0.3–0.9)
Total Bilirubin: 1.9 mg/dL — ABNORMAL HIGH (ref 0.3–1.2)
Total Protein: 5.4 g/dL — ABNORMAL LOW (ref 6.0–8.3)

## 2014-10-30 LAB — PROCALCITONIN
PROCALCITONIN: 12.67 ng/mL
PROCALCITONIN: 15.05 ng/mL

## 2014-10-30 LAB — URINE MICROSCOPIC-ADD ON

## 2014-10-30 LAB — CARBOXYHEMOGLOBIN
Carboxyhemoglobin: 1 % (ref 0.5–1.5)
METHEMOGLOBIN: 1.3 % (ref 0.0–1.5)
O2 Saturation: 87.8 %
Total hemoglobin: 10 g/dL — ABNORMAL LOW (ref 13.5–18.0)

## 2014-10-30 LAB — MAGNESIUM
Magnesium: 2.4 mg/dL (ref 1.5–2.5)
Magnesium: 2.2 mg/dL (ref 1.5–2.5)

## 2014-10-30 LAB — PROTIME-INR
INR: 1.37 (ref 0.00–1.49)
PROTHROMBIN TIME: 17 s — AB (ref 11.6–15.2)

## 2014-10-30 LAB — TROPONIN I
TROPONIN I: 11.93 ng/mL — AB (ref <0.031)
TROPONIN I: 48.82 ng/mL — AB (ref <0.031)
Troponin I: >80 ng/mL (ref <0.031)

## 2014-10-30 LAB — HEPARIN LEVEL (UNFRACTIONATED): Heparin Unfractionated: 0.4 IU/mL (ref 0.30–0.70)

## 2014-10-30 LAB — PHOSPHORUS
Phosphorus: 1 mg/dL — CL (ref 2.3–4.6)
Phosphorus: 2.6 mg/dL (ref 2.3–4.6)

## 2014-10-30 LAB — LACTIC ACID, PLASMA
LACTIC ACID, VENOUS: 4.3 mmol/L — AB (ref 0.5–2.0)
Lactic Acid, Venous: 3.5 mmol/L (ref 0.5–2.0)
Lactic Acid, Venous: 3.8 mmol/L (ref 0.5–2.0)

## 2014-10-30 LAB — MRSA PCR SCREENING: MRSA by PCR: NEGATIVE

## 2014-10-30 LAB — CORTISOL: Cortisol, Plasma: 51.1 ug/dL

## 2014-10-30 MED ORDER — SODIUM CHLORIDE 0.9 % IV SOLN: INTRAVENOUS | Status: DC | PRN

## 2014-10-30 MED ORDER — DEXTROSE-NACL 5-0.45 % IV SOLN
INTRAVENOUS | Status: DC
  Administered 2014-10-30 – 2014-10-31 (×2): via INTRAVENOUS

## 2014-10-30 MED ORDER — HEPARIN BOLUS VIA INFUSION
4000.0000 [IU] | Freq: Once | INTRAVENOUS | Status: AC
  Administered 2014-10-30: 4000 [IU] via INTRAVENOUS
  Filled 2014-10-30: qty 4000

## 2014-10-30 MED ORDER — PANTOPRAZOLE SODIUM 40 MG IV SOLR
40.0000 mg | INTRAVENOUS | Status: DC
  Administered 2014-10-31 (×2): 40 mg via INTRAVENOUS
  Filled 2014-10-30 (×3): qty 40

## 2014-10-30 MED ORDER — SODIUM CHLORIDE 0.9 % IV BOLUS (SEPSIS)
1000.0000 mL | Freq: Once | INTRAVENOUS | Status: AC
  Administered 2014-10-30: 1000 mL via INTRAVENOUS

## 2014-10-30 MED ORDER — SIMVASTATIN 40 MG PO TABS
40.0000 mg | ORAL_TABLET | Freq: Every day | ORAL | Status: DC
  Administered 2014-10-30 – 2014-10-31 (×3): 40 mg via ORAL
  Filled 2014-10-30 (×4): qty 1

## 2014-10-30 MED ORDER — CETYLPYRIDINIUM CHLORIDE 0.05 % MT LIQD
7.0000 mL | Freq: Four times a day (QID) | OROMUCOSAL | Status: DC
  Administered 2014-10-30 – 2014-10-31 (×7): 7 mL via OROMUCOSAL

## 2014-10-30 MED ORDER — GABAPENTIN 300 MG PO CAPS
300.0000 mg | ORAL_CAPSULE | Freq: Three times a day (TID) | ORAL | Status: DC
  Administered 2014-10-30 – 2014-11-01 (×7): 300 mg via ORAL
  Filled 2014-10-30 (×8): qty 1

## 2014-10-30 MED ORDER — SODIUM CHLORIDE 0.9 % IV SOLN: INTRAVENOUS | Status: DC

## 2014-10-30 MED ORDER — CHLORHEXIDINE GLUCONATE 0.12 % MT SOLN
15.0000 mL | Freq: Two times a day (BID) | OROMUCOSAL | Status: DC
  Administered 2014-10-30 – 2014-10-31 (×4): 15 mL via OROMUCOSAL
  Filled 2014-10-30 (×3): qty 15

## 2014-10-30 MED ORDER — HEPARIN SODIUM (PORCINE) 5000 UNIT/ML IJ SOLN: 5000.0000 [IU] | Freq: Three times a day (TID) | INTRAMUSCULAR | Status: DC

## 2014-10-30 MED ORDER — SODIUM CHLORIDE 0.45 % IV SOLN
INTRAVENOUS | Status: DC
  Administered 2014-10-30: 15:00:00 via INTRAVENOUS

## 2014-10-30 MED ORDER — ASPIRIN EC 81 MG PO TBEC
81.0000 mg | DELAYED_RELEASE_TABLET | Freq: Every day | ORAL | Status: DC
  Administered 2014-10-30 – 2014-11-01 (×3): 81 mg via ORAL
  Filled 2014-10-30 (×3): qty 1

## 2014-10-30 MED ORDER — PREGABALIN 50 MG PO CAPS
50.0000 mg | ORAL_CAPSULE | Freq: Two times a day (BID) | ORAL | Status: DC
  Administered 2014-10-30 – 2014-11-01 (×6): 50 mg via ORAL
  Filled 2014-10-30 (×7): qty 1

## 2014-10-30 MED ORDER — SODIUM CHLORIDE 0.9 % IV SOLN: INTRAVENOUS | Status: AC

## 2014-10-30 MED ORDER — TAMSULOSIN HCL 0.4 MG PO CAPS
0.4000 mg | ORAL_CAPSULE | Freq: Every day | ORAL | Status: DC
  Administered 2014-10-30 – 2014-11-01 (×3): 0.4 mg via ORAL
  Filled 2014-10-30 (×3): qty 1

## 2014-10-30 MED ORDER — VITAL HIGH PROTEIN PO LIQD
1000.0000 mL | ORAL | Status: DC
  Administered 2014-10-30: 1000 mL
  Administered 2014-10-31: 09:00:00
  Administered 2014-10-31: 1000 mL
  Filled 2014-10-30 (×3): qty 1000

## 2014-10-30 MED ORDER — POTASSIUM PHOSPHATES 15 MMOLE/5ML IV SOLN
30.0000 mmol | Freq: Once | INTRAVENOUS | Status: AC
  Administered 2014-10-30: 30 mmol via INTRAVENOUS
  Filled 2014-10-30: qty 10

## 2014-10-30 MED ORDER — HEPARIN (PORCINE) IN NACL 100-0.45 UNIT/ML-% IJ SOLN
1100.0000 [IU]/h | INTRAMUSCULAR | Status: DC
  Administered 2014-10-30 – 2014-10-31 (×3): 1100 [IU]/h via INTRAVENOUS
  Filled 2014-10-30 (×7): qty 250

## 2014-10-30 MED ORDER — CLOPIDOGREL BISULFATE 75 MG PO TABS
75.0000 mg | ORAL_TABLET | Freq: Every day | ORAL | Status: DC
  Administered 2014-10-30 – 2014-11-01 (×3): 75 mg via ORAL
  Filled 2014-10-30 (×4): qty 1

## 2014-10-30 MED ORDER — PIPERACILLIN-TAZOBACTAM IN DEX 2-0.25 GM/50ML IV SOLN
2.2500 g | Freq: Three times a day (TID) | INTRAVENOUS | Status: DC
  Administered 2014-10-30 – 2014-10-31 (×5): 2.25 g via INTRAVENOUS
  Filled 2014-10-30 (×7): qty 50

## 2014-10-30 MED ORDER — FINASTERIDE 5 MG PO TABS
5.0000 mg | ORAL_TABLET | Freq: Every day | ORAL | Status: DC
  Administered 2014-10-30 – 2014-11-01 (×3): 5 mg via ORAL
  Filled 2014-10-30 (×3): qty 1

## 2014-10-30 MED ORDER — VANCOMYCIN HCL 10 G IV SOLR
1500.0000 mg | INTRAVENOUS | Status: DC
  Administered 2014-10-30: 1500 mg via INTRAVENOUS
  Filled 2014-10-30: qty 1500

## 2014-10-30 MED ORDER — SODIUM CHLORIDE 0.9 % IV SOLN
INTRAVENOUS | Status: DC
  Administered 2014-10-30 (×2): via INTRAVENOUS

## 2014-10-30 MED ORDER — ASPIRIN 300 MG RE SUPP: 150.0000 mg | Freq: Every day | RECTAL | Status: DC

## 2014-10-30 MED ORDER — PRO-STAT SUGAR FREE PO LIQD
30.0000 mL | Freq: Two times a day (BID) | ORAL | Status: DC
  Administered 2014-10-30 – 2014-10-31 (×3): 30 mL
  Filled 2014-10-30 (×4): qty 30

## 2014-10-30 NOTE — Progress Notes (Signed)
Pt's niece, wife, and pastor visited today. According to pt's wife, pt's niece Frances NickelsLeigh Rains is the POA. In addition to pt's wife information, the following contact info was received:  POFrances Nickels: Leigh Rains:  [phone]  Pt's pastor:  Pamalee LeydenBob Voorhees : [phone]   Password: [email or web address]

## 2014-10-30 NOTE — Progress Notes (Signed)
eLink Physician-Brief Progress Note Patient Name: Brandon Evans DOB: [birth year] MRN: [id number]   Date of Service  10/30/2014  HPI/Events of Note  Best Practice  eICU Interventions  Protonix IV for stress ulcer propy while on vent.     Intervention Category Intermediate Interventions: Best-practice therapies (e.g. DVT, beta blocker, etc.)  DETERDING,ELIZABETH 10/30/2014, 11:26 PM

## 2014-10-30 NOTE — Procedures (Signed)
Arterial Catheter Insertion Procedure Note Brandon Evans [id number] 1930-03-04  Procedure: Insertion of Arterial Catheter  Indications: Blood pressure monitoring  Procedure Details Consent: Unable to obtain consent because of emergent medical necessity. Time Out: Verified patient identification, verified procedure, site/side was marked, verified correct patient position, special equipment/implants available, medications/allergies/relevent history reviewed, required imaging and test results available.  Performed  Maximum sterile technique was used including antiseptics, gloves, gown, hand hygiene and mask. Skin prep: Chlorhexidine; local anesthetic administered 20 gauge catheter was inserted into right radial artery using the Seldinger technique.  Evaluation Blood flow good; BP tracing good. Complications: No apparent complications.   Augustine RadarCarter, Patton Rabinovich Marie 10/30/2014

## 2014-10-30 NOTE — Progress Notes (Signed)
Cardiology MD Tresa EndoKelly notified re: troponin 11.93.  Elink MD Katrinka BlazingSmith notified re: Lactic acid 3.5 and 0200 ABG results.

## 2014-10-30 NOTE — Progress Notes (Signed)
[facility]. She does not have a personal telephone number but gave the facility's phone # [phone]) to be called back on. Wife seems unaware of pt's current medical condition. NP notified.

## 2014-10-30 NOTE — Progress Notes (Signed)
PULMONARY / CRITICAL CARE MEDICINE HISTORY AND PHYSICAL EXAMINATION   Name: Brandon Evans MRN: [id number] DOB: 10/28/1929    ADMISSION DATE:  December 01, 2014  PRIMARY SERVICE: PCCM  CHIEF COMPLAINT:  Elevated blood glucose and abdominal distention.  BRIEF PATIENT DESCRIPTION: [age] M with innumerable medical issues presenting with AMS, shock, elevated troponins all in the setting of DKA.   SIGNIFICANT EVENTS / STUDIES:  Intubated 2/20  LINES / TUBES: L IJ 2/20 ETT 2/20  CULTURES: Blood cultures x 2, 2/20 Urine Culture, 2/20 Tracheal Aspirate, 2/20  ANTIBIOTICS: Vanc, 2/20 - Zosyn, 2/20 -  SUBJECTIVE:  Sedated   VITAL SIGNS: Temp:  [97.4 F (36.3 C)-99.4 F (37.4 C)] 97.5 F (36.4 C) (02/21 0739) Pulse Rate:  [30-104] 75 (02/21 0907) Resp:  [0-34] 34 (02/21 1000) BP: (56-136)/(27-86) 95/46 mmHg (02/21 1000) SpO2:  [72 %-100 %] 100 % (02/21 0907) Arterial Line BP: (113-157)/(46-64) 137/53 mmHg (02/21 1000) FiO2 (%):  [40 %-100 %] 40 % (02/21 0907) Weight:  [94.6 kg (208 lb 8.9 oz)] 94.6 kg (208 lb 8.9 oz) (02/21 0000) HEMODYNAMICS: CVP:  [18 mmHg] 18 mmHg VENTILATOR SETTINGS: Vent Mode:  [-] PRVC FiO2 (%):  [40 %-100 %] 40 % Set Rate:  [20 bmp-34 bmp] 34 bmp Vt Set:  [500 mL-640 mL] 640 mL PEEP:  [5 cmH20] 5 cmH20 Plateau Pressure:  [15 cmH20-22 cmH20] 22 cmH20 INTAKE / OUTPUT: Intake/Output      02/20 0701 - 02/21 0700 02/21 0701 - 02/22 0700   I.V. (mL/kg) 1185.3 (12.5) 552.3 (5.8)   IV Piggyback 600    Total Intake(mL/kg) 1785.3 (18.9) 552.3 (5.8)   Urine (mL/kg/hr) 40 30 (0.1)   Total Output 40 30   Net +1745.3 +522.3          PHYSICAL EXAMINATION: General:  Elderly M, Intubated. Sedated.  Neuro:  Not actively sedated, non-responsive HEENT:  Sclera anicteric MMM, ETT present Neck: Trachea supple and midline, (-) LAN or JVD Cardiovascular:  RRR,  NS1/S2, (-) MRG Lungs:  Coarse mechanical BS bilaterally Abdomen:  S/NT/ND/(+)BS Musculoskeletal:  1+  pitting edema to shins bilaterally Skin:  Chronic PAD changes in LE bilaterally, stage 1 decub spanning most of perianal region, up to 10 cm  LABS:  CBC  Recent Labs Lab Feb 10, 2015 1856 10/30/14 0400  WBC 22.9* 17.8*  HGB 11.3* 10.8*  HCT 37.5* 33.4*  PLT 322 263   Coag's  Recent Labs Lab 10/30/14 0400  INR 1.37   BMET  Recent Labs Lab 10/30/14 0010 10/30/14 0430 10/30/14 0800  NA 134* 134* 136  K 5.0 4.2 4.0  CL 101 107 109  CO2 10* 10* 14*  BUN 60* 58* 62*  CREATININE 4.12* 4.06* 3.99*  GLUCOSE 798* 708* 601*   Electrolytes  Recent Labs Lab 10/30/14 0010 10/30/14 0430 10/30/14 0800  CALCIUM 8.0* 8.0* 8.0*   Sepsis Markers  Recent Labs Lab 10/30/14 0027 10/30/14 0034 10/30/14 0334 10/30/14 0630  LATICACIDVEN  --  3.5* 3.8* 4.3*  PROCALCITON 12.67  --   --   --    ABG  Recent Labs Lab 10/30/14 0112 10/30/14 0203 10/30/14 0419  PHART 7.186* 7.239* 7.284*  PCO2ART 20.3* 17.0* 17.4*  PO2ART 144.0* 159.0* 186.0*   Liver Enzymes  Recent Labs Lab Feb 10, 2015 1856 10/30/14 0400  AST 44* 275*  ALT 25 83*  ALKPHOS 66 66  BILITOT 2.0* 1.9*  ALBUMIN 3.7 2.9*   Cardiac Enzymes  Recent Labs Lab 10/30/14 0030 10/30/14 0630  TROPONINI 11.93* 48.82*  Glucose  Recent Labs Lab 10/30/14 0416 10/30/14 0512 10/30/14 0605 10/30/14 0716 10/30/14 0840 10/30/14 0953  GLUCAP >600* >600* 571* 584* 561* 501*    Imaging Dg Abd 1 View  10/30/2014   CLINICAL DATA:  Abdominal distension, shock, diabetes, coronary artery disease, chronic kidney disease, chronic systolic CHF and hypertensive heart disease  EXAM: ABDOMEN - 1 VIEW  COMPARISON:  Portable exam 0634 hr compared to CT abdomen and pelvis 07/13/2014  FINDINGS: Tip of nasogastric tube is in stomach.  Nonobstructive bowel gas pattern.  No bowel dilatation or bowel wall thickening.  Bones demineralized.  Scattered pelvic phleboliths without definite urinary tract calcification.  Atelectasis  versus infiltrate and small effusion at LEFT lung base.  IMPRESSION: Nonobstructive bowel gas pattern.   Electronically Signed   By: Ulyses Southward M.D.   On: 10/30/2014 07:20   Dg Chest Port 1 View  10/30/2014   CLINICAL DATA:  Respiratory failure  EXAM: PORTABLE CHEST - 1 VIEW  COMPARISON:  Portable exam 0630 hr compared to November 23, 2014  FINDINGS: Tip of endotracheal tube projects 4.7 cm above carina.  Nasogastric tube extends into stomach.  LEFT jugular central venous catheter tip projecting over SVC.  Enlargement of cardiac silhouette post median sternotomy.  Rotated to the LEFT.  Minimal RIGHT basilar atelectasis.  Mild atelectasis versus consolidation in LEFT lower lobe with small LEFT pleural effusion.  Upper lungs clear.  No pneumothorax.  IMPRESSION: Mild atelectasis versus consolidation in LEFT lower lobe with small pleural effusion.  Minimal RIGHT base atelectasis.   Electronically Signed   By: Ulyses Southward M.D.   On: 10/30/2014 07:22   Dg Chest Portable 1 View  November 23, 2014   CLINICAL DATA:  Hyperglycemia.  Intubated.  EXAM: PORTABLE CHEST - 1 VIEW  COMPARISON:  23-Nov-2014 at 19:04  FINDINGS: Endotracheal tube is between the clavicular heads and the carina. Nasogastric tube extends into the stomach and off the inferior edge of the image. Left jugular central line extends into the low SVC.  There is no pneumothorax. There is moderate unchanged cardiomegaly. Mild basilar opacities are present, left greater than right, unchanged.  IMPRESSION: Line and tubes as described. No pneumothorax. Unchanged basilar opacities.   Electronically Signed   By: Ellery Plunk M.D.   On: 11/23/14 22:30   Dg Chest Port 1 View  2014-11-23   CLINICAL DATA:  Initial evaluation for altered mental status, abdominal distention for 3 weeks, hyperglycemia  EXAM: PORTABLE CHEST - 1 VIEW  COMPARISON:  10/16/2013  FINDINGS: Stable moderate enlargement of cardiac silhouette. Patient is status post median sternotomy. Vascular  pattern normal. Mild bibasilar opacity most consistent with atelectasis.  IMPRESSION: Mild bilateral lower lobe atelectasis.   Electronically Signed   By: Esperanza Heir M.D.   On: Nov 23, 2014 19:17    EKG: Inferolateral ST depressions.  CXR: R directed ETT. Slightly deep CVC.  ASSESSMENT / PLAN:  Active Problems:   Acute systolic CHF (congestive heart failure)   CAD (coronary artery disease)   Multiple organ system failure   Shock   NSTEMI (non-ST elevated myocardial infarction)   PULMONARY A: Acute hypoxic respiratory failure  P:   Lung protective ventilation SBT/WUA unable to wean with current acidosis , MV needs Seems int prom better, repeat pcxr in am   CARDIOVASCULAR A: Cardiogenic shock-->resolved  NSTEMI  CAD ICM HTN P:   Aggressive IVF Check cortisol F/u echo Asa, Heparin Drip Med interventions only , appreciate cards eval, not candidate CABG  RENAL A: AKI  on CKD: Hyperkalemia: improved  Hyponatremia: Likely 2/2 dehyrdation Metabolic acidosis/DKA NONAG noted P:   Immediate BMP IVF Serial BMPs DKA protocol  Not candidate for HD consider bicarb or NONAG, especially i K rises Avoid saline urther  GASTROINTESTINAL A: Elevated Bilirubin: Suspect 2/2 hemoconcentration. Abdominal Distention: Benign exam. P:   Monitor CMPs Start tube feeds   HEMATOLOGIC A: Anemia: Likely AOCD  INFECTIOUS A: No Clear Infectious Source: doubt infected  P:   BCX2 2/20>>> vanc 2/20>>> Zosyn 2/20>>>  Pct algo to dc abx  ENDOCRINE A: DKA:  DM:  P:   DKA insulin drip protocol  NEUROLOGIC A: Acute encephalopathy   P:   Likely 2/2 metabolic derrangements and shock  Decubitous Ulcer: Wound Care   TODAY'S SUMMARY:  Presents w/ NSTEMI and DKA. Not sure which came first. Now out of shock. Still on DKA protocol. Now off pressors. Gap slowly improving. Glucose trending down. Spoke w/ Spouse via phone. Confirmed DNR status and no escalation of care. Will  cont current rx.. Work towards medical rx of NSTEMI, regulating glucose and correcting acidosis. If declines we will not escalate.  Simonne Martinet ACNP-BC [facility] Pulmonary/Critical Care Pager # [phone] OR # [phone] if no answer     10/30/2014, 10:53 AM   STAFF NOTE: I, Rory Percy, MD FACP have personally reviewed patient's available data, including medical history, events of note, physical examination and test results as part of my evaluation. I have discussed with resident/NP and other care providers such as pharmacist, RN and RRT. In addition, I personally evaluated patient and elicited key findings of: coarse BS, massive MI, limited options, poor prognosis, HD or heroics would not chang eoutcome, may need bicarb or K i rise or NONAG, change from saline to 1/2 NS, consider increase in MV on vent, may need to consider comort care soon, PCT assessment likely we can dc al abx The patient is critically ill with multiple organ systems failure and requires high complexity decision making for assessment and support, frequent evaluation and titration of therapies, application of advanced monitoring technologies and extensive interpretation of multiple databases.   Critical Care Time devoted to patient care services described in this note is30  Minutes. This time reflects time of care of this signee: Rory Percy, MD FACP. This critical care time does not reflect procedure time, or teaching time or supervisory time of PA/NP/Med student/Med Resident etc but could involve care discussion time. Rest per NP/medical resident whose note is outlined above and that I agree with   Mcarthur Rossetti. Tyson Alias, MD, FACP Pgr: [phone] [place] [facility] 10/30/2014 2:12 PM

## 2014-10-30 NOTE — Progress Notes (Signed)
SUBJECTIVE:  Intubated and sedated  OBJECTIVE:   Vitals:   Filed Vitals:   10/30/14 0700 10/30/14 0739 10/30/14 0800 10/30/14 0907  BP: 91/41  101/45 107/76  Pulse: 76   75  Temp:  97.5 F (36.4 C)    TempSrc:  Oral    Resp: 34  30 34  Height:      Weight:      SpO2: 100%   100%   I&O's:   Intake/Output Summary (Last 24 hours) at 10/30/14 1610 Last data filed at 10/30/14 0800  Gross per 24 hour  Intake 2055.68 ml  Output     70 ml  Net 1985.68 ml   TELEMETRY: Reviewed telemetry pt in NSR:     PHYSICAL EXAM General: intubated and sedated Lungs:   Clear bilaterally to auscultation anteriorly Heart:   HRRR S1 S2 Pulses are 2+ & equal. Abdomen: Bowel sounds are positive, abdomen soft and non-tender without masse Extremities:   No clubbing, cyanosis or edema.  DP +1  LABS: Basic Metabolic Panel:  Recent Labs  96/04/54 0010 10/30/14 0430  NA 134* 134*  K 5.0 4.2  CL 101 107  CO2 10* 10*  GLUCOSE 798* 708*  BUN 60* 58*  CREATININE 4.12* 4.06*  CALCIUM 8.0* 8.0*   Liver Function Tests:  Recent Labs  10/17/2014 1856 10/30/14 0400  AST 44* 275*  ALT 25 83*  ALKPHOS 66 66  BILITOT 2.0* 1.9*  PROT 6.5 5.4*  ALBUMIN 3.7 2.9*    Recent Labs  10/26/2014 1856  LIPASE 15   CBC:  Recent Labs  10/31/2014 1856 10/30/14 0400  WBC 22.9* 17.8*  NEUTROABS 18.1* 14.6*  HGB 11.3* 10.8*  HCT 37.5* 33.4*  MCV 111.6* 103.4*  PLT 322 263   Cardiac Enzymes:  Recent Labs  10/30/14 0030 10/30/14 0630  TROPONINI 11.93* 48.82*   BNP: Invalid input(s): POCBNP D-Dimer: No results for input(s): DDIMER in the last 72 hours. Hemoglobin A1C: No results for input(s): HGBA1C in the last 72 hours. Fasting Lipid Panel: No results for input(s): CHOL, HDL, LDLCALC, TRIG, CHOLHDL, LDLDIRECT in the last 72 hours. Thyroid Function Tests: No results for input(s): TSH, T4TOTAL, T3FREE, THYROIDAB in the last 72 hours.  Invalid input(s): FREET3 Anemia Panel: No  results for input(s): VITAMINB12, FOLATE, FERRITIN, TIBC, IRON, RETICCTPCT in the last 72 hours. Coag Panel:   Lab Results  Component Value Date   INR 1.37 10/30/2014   INR 0.96 10/15/2013    RADIOLOGY: Dg Abd 1 View  10/30/2014   CLINICAL DATA:  Abdominal distension, shock, diabetes, coronary artery disease, chronic kidney disease, chronic systolic CHF and hypertensive heart disease  EXAM: ABDOMEN - 1 VIEW  COMPARISON:  Portable exam 0634 hr compared to CT abdomen and pelvis 07/13/2014  FINDINGS: Tip of nasogastric tube is in stomach.  Nonobstructive bowel gas pattern.  No bowel dilatation or bowel wall thickening.  Bones demineralized.  Scattered pelvic phleboliths without definite urinary tract calcification.  Atelectasis versus infiltrate and small effusion at LEFT lung base.  IMPRESSION: Nonobstructive bowel gas pattern.   Electronically Signed   By: Ulyses Southward M.D.   On: 10/30/2014 07:20   Dg Chest Port 1 View  10/30/2014   CLINICAL DATA:  Respiratory failure  EXAM: PORTABLE CHEST - 1 VIEW  COMPARISON:  Portable exam 0630 hr compared to 10/28/2014  FINDINGS: Tip of endotracheal tube projects 4.7 cm above carina.  Nasogastric tube extends into stomach.  LEFT jugular central venous catheter tip  projecting over SVC.  Enlargement of cardiac silhouette post median sternotomy.  Rotated to the LEFT.  Minimal RIGHT basilar atelectasis.  Mild atelectasis versus consolidation in LEFT lower lobe with small LEFT pleural effusion.  Upper lungs clear.  No pneumothorax.  IMPRESSION: Mild atelectasis versus consolidation in LEFT lower lobe with small pleural effusion.  Minimal RIGHT base atelectasis.   Electronically Signed   By: Ulyses SouthwardMark  Boles M.D.   On: 10/30/2014 07:22   Dg Chest Portable 1 View  2014-09-22   CLINICAL DATA:  Hyperglycemia.  Intubated.  EXAM: PORTABLE CHEST - 1 VIEW  COMPARISON:  02016-01-14 at 19:04  FINDINGS: Endotracheal tube is between the clavicular heads and the carina. Nasogastric tube  extends into the stomach and off the inferior edge of the image. Left jugular central line extends into the low SVC.  There is no pneumothorax. There is moderate unchanged cardiomegaly. Mild basilar opacities are present, left greater than right, unchanged.  IMPRESSION: Line and tubes as described. No pneumothorax. Unchanged basilar opacities.   Electronically Signed   By: Ellery Plunkaniel R Mitchell M.D.   On: 02016-01-14 22:30   Dg Chest Port 1 View  2014-09-22   CLINICAL DATA:  Initial evaluation for altered mental status, abdominal distention for 3 weeks, hyperglycemia  EXAM: PORTABLE CHEST - 1 VIEW  COMPARISON:  10/16/2013  FINDINGS: Stable moderate enlargement of cardiac silhouette. Patient is status post median sternotomy. Vascular pattern normal. Mild bibasilar opacity most consistent with atelectasis.  IMPRESSION: Mild bilateral lower lobe atelectasis.   Electronically Signed   By: Esperanza Heiraymond  Rubner M.D.   On: 02016-01-14 19:17    Assessment/Plan: Mr. Idelle JoSigmon is an 81 yo man with PMH of T2DM, HTN, CKD stage III/IV, systolic heart failure with last known EF ~ 45% with presumed CAD who presents with altered mental status and metabolic disarray. Differential diagnosis for DKA and metabolic disarray is broad including thyroid disease, sepsis/infection, MI, pancreatitis among other triggers/etiologies. His troponin has climbed significantly; however, he also has renal failure without clearance of troponin. For now agree with treatment for DKA, broad spectrum antibiotics and hemodynamic support. Continue to obtain history from family as able.  Problem List DKA/Metabolic Disarray NSTEMI - Most likely Type I given degree of troponin elevation Elevated lactate Shock Presumed CAD with abnormal ECG Hypertension, T2DM, Dyslipidemia Acute on chronic kidney disease/renal failure Hyperkalemia Anion Gap Metabolic Acidosis  Systolic Heart Failure  Suspect he has had an acute NSTEMI that resulted in DKA and worsening  renal failure.  Unlikely to have a troponin elevation this high with demand ischemia even in the setting of acute renal failure.  He has a history of PND, SOB, orthopnea and edema fall 2014 and was found to have diffuse anterolateral T wave changes and Echo showed EF 45% and Lexiscan myoview test in January 2015 with inferolateral ischemia and transient ischemic dilatation suggestive of multivessel ASCAD.  Cath 10 years ago with moderate CAD.  He was evaluated by nephrology and CVTS a year ago 10/2013 during hospitalization for CHF and felt not to be a surgical candidate due to CKD stage IV and has been managed medically.    - continue to cycle biomarkers, watch on telemetry, treat hyperkalemia/metabolic disarray - continue heparin gtt, daily aspirin/Plavix - continue simvastatin 40 mg daily - repeat echocardiogram - at this time not a cath candidate due to acute renal failure.  Will need to follow renal function closely.  Further cardiac w/u pending recovery of renal function.   - No ACE  I or ARB due to renal failure/shock - No BB due to shock - continue pressor support as needed with Levo   Quintella Reichert, MD  10/30/2014  9:18 AM

## 2014-10-30 NOTE — Progress Notes (Addendum)
ANTIBIOTIC CONSULT NOTE - INITIAL  Pharmacy Consult for Vancomycin/Zosyn  Indication: rule out sepsis  Allergies  Allergen Reactions  . Contrast Media [Iodinated Diagnostic Agents] Hives    Patient Measurements: Height: 6\' 1"  (185.4 cm) IBW/kg (Calculated) : 79.9  Vital Signs: Temp: 99.4 F (37.4 C) (02/20 2358) Temp Source: Oral (02/20 2358) BP: 127/48 mmHg (02/20 2231) Pulse Rate: 101 (02/20 2231)  Labs:  Recent Labs  10/28/2014 1856  WBC 22.9*  HGB 11.3*  PLT 322  CREATININE 3.46*   Medical History: Past Medical History  Diagnosis Date  . Obesity   . DM retinopathy   . Unspecified venous (peripheral) insufficiency   . Anemia in chronic kidney disease(285.21)   . Unspecified vitamin D deficiency   . CAD (coronary artery disease) 10/15/2013  . Chronic kidney disease (CKD), stage IV (severe)   . BPH (benign prostatic hypertrophy) 10/15/2013  . Chronic systolic CHF (congestive heart failure)   . Hypertensive heart disease   . History of constrictive pericarditis   . Hyperlipidemia   . Shortness of breath     "recently w/lying down and w/exertion" (10/15/2013)  . DM (diabetes mellitus) type II controlled with renal manifestation   . Spondylitis, cervical   . Arthritis     "hands and feet" (10/15/2013)   Assessment: 81 [facility]-organ dysfunction, DKA, elevated WBC, to start broad spectrum anti-biotics.   Goal of Therapy:  Vancomycin trough level 15-20 mcg/ml  Plan:  -Vancomycin 1500 mg IV q48h, may increase depending on renal function trend -Zosyn 2.25g IV q8h -Trend WBC, temp, renal function  -Drug levels as indicated   Brandon Evans, [name] 10/30/2014,12:06 AM  =================================== Addendum 12:44 AM  Troponin mildly elevated, starting heparin, Hgb 11.3, PLTS 322 -Heparin 4000 units BOLUS -Start heparin drip 1100 units/hr -1000 HL -Daily CBC/HL -Monitor for bleeding Brandon Evans, PharmD

## 2014-10-30 NOTE — Progress Notes (Signed)
Utilization Review Completed.Brandon Evans T2/21/2016  

## 2014-10-30 NOTE — Progress Notes (Signed)
ANTICOAGULATION CONSULT NOTE - Follow Up Consult  Pharmacy Consult for heparin Indication: chest pain/ACS  Allergies  Allergen Reactions  . Contrast Media [Iodinated Diagnostic Agents] Hives    Patient Measurements: Height: 6\' 1"  (185.4 cm) Weight: 208 lb 8.9 oz (94.6 kg) IBW/kg (Calculated) : 79.9 Heparin Dosing Weight: 95 kg  Vital Signs: Temp: 97.6 F (36.4 C) (02/21 1552) Temp Source: Oral (02/21 1552) BP: 85/45 mmHg (02/21 1600) Pulse Rate: 71 (02/21 1558)  Labs:  Recent Labs  11/04/2014 1856  10/30/14 0030 10/30/14 0400  10/30/14 0630 10/30/14 0800 10/30/14 1307 10/30/14 1314 10/30/14 1600 10/30/14 1710  HGB 11.3*  --   --  10.8*  --   --   --   --   --   --   --   HCT 37.5*  --   --  33.4*  --   --   --   --   --   --   --   PLT 322  --   --  263  --   --   --   --   --   --   --   LABPROT  --   --   --  17.0*  --   --   --   --   --   --   --   INR  --   --   --  1.37  --   --   --   --   --   --   --   HEPARINUNFRC  --   --   --   --   --   --   --   --   --   --  0.40  CREATININE 3.46*  < >  --   --   < >  --  3.99*  --  3.68* 3.52*  --   TROPONINI  --   --  11.93*  --   --  48.82*  --  >80.00*  --   --   --   < > = values in this interval not displayed.  Estimated Creatinine Clearance: 18 mL/min (by C-G formula based on Cr of 3.52).   Assessment: Patient is an 81 y.o M on heparin for ACS.  Troponin was >80 at 1PM today.  Holding off on cath for now d/t poor renal function.   First heparin level is at goal with 0.40.  No bleeding documented.   Goal of Therapy:  Heparin level 0.3-0.7 units/ml Monitor platelets by anticoagulation protocol: Yes   Plan:  - continue heparin drip at 1100 units/hr - will f/u with AM labs and adjust rate if needed  Jasmynn Pfalzgraf P 10/30/2014,5:54 PM

## 2014-10-31 ENCOUNTER — Inpatient Hospital Stay (HOSPITAL_COMMUNITY): Payer: Medicare Other

## 2014-10-31 DIAGNOSIS — J9600 Acute respiratory failure, unspecified whether with hypoxia or hypercapnia: Secondary | ICD-10-CM | POA: Insufficient documentation

## 2014-10-31 LAB — CBC
HCT: 29.3 % — ABNORMAL LOW (ref 39.0–52.0)
HEMOGLOBIN: 10.3 g/dL — AB (ref 13.0–17.0)
MCH: 33 pg (ref 26.0–34.0)
MCHC: 35.2 g/dL (ref 30.0–36.0)
MCV: 93.9 fL (ref 78.0–100.0)
Platelets: 205 10*3/uL (ref 150–400)
RBC: 3.12 MIL/uL — ABNORMAL LOW (ref 4.22–5.81)
RDW: 13.3 % (ref 11.5–15.5)
WBC: 14.1 10*3/uL — AB (ref 4.0–10.5)

## 2014-10-31 LAB — COMPREHENSIVE METABOLIC PANEL
ALT: 25 U/L (ref 0–53)
AST: 44 U/L — ABNORMAL HIGH (ref 0–37)
Albumin: 3.7 g/dL (ref 3.5–5.2)
Alkaline Phosphatase: 66 U/L (ref 39–117)
Anion gap: 28 — ABNORMAL HIGH (ref 5–15)
BUN: 60 mg/dL — ABNORMAL HIGH (ref 6–23)
CALCIUM: 8.5 mg/dL (ref 8.4–10.5)
CHLORIDE: 98 mmol/L (ref 96–112)
CO2: 5 mmol/L — CL (ref 19–32)
Creatinine, Ser: 3.46 mg/dL — ABNORMAL HIGH (ref 0.50–1.35)
GFR calc non Af Amer: 15 mL/min — ABNORMAL LOW (ref >90)
GFR, EST AFRICAN AMERICAN: 17 mL/min — AB (ref >90)
GLUCOSE: 930 mg/dL — AB (ref 70–99)
Sodium: 131 mmol/L — ABNORMAL LOW (ref 135–145)
Total Bilirubin: 2 mg/dL — ABNORMAL HIGH (ref 0.3–1.2)
Total Protein: 6.5 g/dL (ref 6.0–8.3)

## 2014-10-31 LAB — GLUCOSE, CAPILLARY
GLUCOSE-CAPILLARY: 128 mg/dL — AB (ref 70–99)
GLUCOSE-CAPILLARY: 132 mg/dL — AB (ref 70–99)
GLUCOSE-CAPILLARY: 133 mg/dL — AB (ref 70–99)
GLUCOSE-CAPILLARY: 138 mg/dL — AB (ref 70–99)
GLUCOSE-CAPILLARY: 144 mg/dL — AB (ref 70–99)
GLUCOSE-CAPILLARY: 151 mg/dL — AB (ref 70–99)
GLUCOSE-CAPILLARY: 162 mg/dL — AB (ref 70–99)
GLUCOSE-CAPILLARY: 165 mg/dL — AB (ref 70–99)
GLUCOSE-CAPILLARY: 166 mg/dL — AB (ref 70–99)
GLUCOSE-CAPILLARY: 218 mg/dL — AB (ref 70–99)
Glucose-Capillary: 169 mg/dL — ABNORMAL HIGH (ref 70–99)
Glucose-Capillary: 122 mg/dL — ABNORMAL HIGH (ref 70–99)
Glucose-Capillary: 162 mg/dL — ABNORMAL HIGH (ref 70–99)
Glucose-Capillary: 183 mg/dL — ABNORMAL HIGH (ref 70–99)
Glucose-Capillary: 210 mg/dL — ABNORMAL HIGH (ref 70–99)
Glucose-Capillary: 115 mg/dL — ABNORMAL HIGH (ref 70–99)
Glucose-Capillary: 126 mg/dL — ABNORMAL HIGH (ref 70–99)
Glucose-Capillary: 127 mg/dL — ABNORMAL HIGH (ref 70–99)
Glucose-Capillary: 129 mg/dL — ABNORMAL HIGH (ref 70–99)
Glucose-Capillary: 133 mg/dL — ABNORMAL HIGH (ref 70–99)
Glucose-Capillary: 142 mg/dL — ABNORMAL HIGH (ref 70–99)
Glucose-Capillary: 324 mg/dL — ABNORMAL HIGH (ref 70–99)

## 2014-10-31 LAB — BASIC METABOLIC PANEL
ANION GAP: 10 (ref 5–15)
ANION GAP: 7 (ref 5–15)
ANION GAP: 8 (ref 5–15)
Anion gap: 7 (ref 5–15)
Anion gap: 7 (ref 5–15)
Anion gap: 7 (ref 5–15)
BUN: 50 mg/dL — AB (ref 6–23)
BUN: 51 mg/dL — ABNORMAL HIGH (ref 6–23)
BUN: 53 mg/dL — ABNORMAL HIGH (ref 6–23)
BUN: 54 mg/dL — ABNORMAL HIGH (ref 6–23)
BUN: 56 mg/dL — ABNORMAL HIGH (ref 6–23)
BUN: 59 mg/dL — ABNORMAL HIGH (ref 6–23)
CALCIUM: 7.8 mg/dL — AB (ref 8.4–10.5)
CALCIUM: 7.9 mg/dL — AB (ref 8.4–10.5)
CHLORIDE: 114 mmol/L — AB (ref 96–112)
CHLORIDE: 114 mmol/L — AB (ref 96–112)
CO2: 15 mmol/L — ABNORMAL LOW (ref 19–32)
CO2: 16 mmol/L — ABNORMAL LOW (ref 19–32)
CO2: 17 mmol/L — ABNORMAL LOW (ref 19–32)
CO2: 17 mmol/L — ABNORMAL LOW (ref 19–32)
CO2: 20 mmol/L (ref 19–32)
CO2: 22 mmol/L (ref 19–32)
CREATININE: 3.27 mg/dL — AB (ref 0.50–1.35)
CREATININE: 3.04 mg/dL — AB (ref 0.50–1.35)
CREATININE: 2.86 mg/dL — AB (ref 0.50–1.35)
Calcium: 7.7 mg/dL — ABNORMAL LOW (ref 8.4–10.5)
Calcium: 7.6 mg/dL — ABNORMAL LOW (ref 8.4–10.5)
Calcium: 8 mg/dL — ABNORMAL LOW (ref 8.4–10.5)
Calcium: 7.5 mg/dL — ABNORMAL LOW (ref 8.4–10.5)
Chloride: 112 mmol/L (ref 96–112)
Chloride: 113 mmol/L — ABNORMAL HIGH (ref 96–112)
Chloride: 116 mmol/L — ABNORMAL HIGH (ref 96–112)
Chloride: 116 mmol/L — ABNORMAL HIGH (ref 96–112)
Creatinine, Ser: 2.93 mg/dL — ABNORMAL HIGH (ref 0.50–1.35)
Creatinine, Ser: 2.97 mg/dL — ABNORMAL HIGH (ref 0.50–1.35)
Creatinine, Ser: 3.4 mg/dL — ABNORMAL HIGH (ref 0.50–1.35)
GFR calc Af Amer: 18 mL/min — ABNORMAL LOW (ref >90)
GFR calc Af Amer: 19 mL/min — ABNORMAL LOW (ref >90)
GFR calc Af Amer: 20 mL/min — ABNORMAL LOW (ref >90)
GFR calc Af Amer: 21 mL/min — ABNORMAL LOW (ref >90)
GFR calc Af Amer: 21 mL/min — ABNORMAL LOW (ref >90)
GFR calc Af Amer: 22 mL/min — ABNORMAL LOW (ref >90)
GFR calc non Af Amer: 16 mL/min — ABNORMAL LOW (ref >90)
GFR calc non Af Amer: 18 mL/min — ABNORMAL LOW (ref >90)
GFR calc non Af Amer: 18 mL/min — ABNORMAL LOW (ref >90)
GFR calc non Af Amer: 18 mL/min — ABNORMAL LOW (ref >90)
GFR, EST NON AFRICAN AMERICAN: 15 mL/min — AB (ref >90)
GFR, EST NON AFRICAN AMERICAN: 19 mL/min — AB (ref >90)
GLUCOSE: 131 mg/dL — AB (ref 70–99)
GLUCOSE: 166 mg/dL — AB (ref 70–99)
GLUCOSE: 183 mg/dL — AB (ref 70–99)
Glucose, Bld: 142 mg/dL — ABNORMAL HIGH (ref 70–99)
Glucose, Bld: 181 mg/dL — ABNORMAL HIGH (ref 70–99)
Glucose, Bld: 311 mg/dL — ABNORMAL HIGH (ref 70–99)
POTASSIUM: 3.5 mmol/L (ref 3.5–5.1)
POTASSIUM: 4.9 mmol/L (ref 3.5–5.1)
Potassium: 3.1 mmol/L — ABNORMAL LOW (ref 3.5–5.1)
Potassium: 3.2 mmol/L — ABNORMAL LOW (ref 3.5–5.1)
Potassium: 3.7 mmol/L (ref 3.5–5.1)
Potassium: 4 mmol/L (ref 3.5–5.1)
SODIUM: 139 mmol/L (ref 135–145)
Sodium: 137 mmol/L (ref 135–145)
Sodium: 137 mmol/L (ref 135–145)
Sodium: 140 mmol/L (ref 135–145)
Sodium: 142 mmol/L (ref 135–145)
Sodium: 143 mmol/L (ref 135–145)

## 2014-10-31 LAB — BLOOD GAS, ARTERIAL
ACID-BASE DEFICIT: 6.4 mmol/L — AB (ref 0.0–2.0)
Bicarbonate: 15.2 mEq/L — ABNORMAL LOW (ref 20.0–24.0)
Drawn by: 31101
FIO2: 0.4 %
O2 Saturation: 99.2 %
PCO2 ART: 15.2 mmHg — AB (ref 35.0–45.0)
PEEP/CPAP: 5 cmH2O
Patient temperature: 98.6
RATE: 34 resp/min
TCO2: 15.6 mmol/L (ref 0–100)
VT: 640 mL
pH, Arterial: 7.604 (ref 7.350–7.450)
pO2, Arterial: 176 mmHg — ABNORMAL HIGH (ref 80.0–100.0)

## 2014-10-31 LAB — HEPARIN LEVEL (UNFRACTIONATED): Heparin Unfractionated: 0.62 IU/mL (ref 0.30–0.70)

## 2014-10-31 LAB — URINE CULTURE
Colony Count: NO GROWTH
Culture: NO GROWTH

## 2014-10-31 LAB — MAGNESIUM: Magnesium: 2.2 mg/dL (ref 1.5–2.5)

## 2014-10-31 LAB — PROCALCITONIN: Procalcitonin: 11.15 ng/mL

## 2014-10-31 LAB — PHOSPHORUS: Phosphorus: 3.8 mg/dL (ref 2.3–4.6)

## 2014-10-31 MED ORDER — INSULIN ASPART 100 UNIT/ML ~~LOC~~ SOLN
0.0000 [IU] | Freq: Three times a day (TID) | SUBCUTANEOUS | Status: DC
  Administered 2014-10-31: 5 [IU] via SUBCUTANEOUS

## 2014-10-31 MED ORDER — POTASSIUM CHLORIDE CRYS ER 20 MEQ PO TBCR: 40.0000 meq | EXTENDED_RELEASE_TABLET | ORAL | Status: DC

## 2014-10-31 MED ORDER — POTASSIUM CHLORIDE CRYS ER 10 MEQ PO TBCR
40.0000 meq | EXTENDED_RELEASE_TABLET | ORAL | Status: AC
  Administered 2014-10-31: 40 meq via ORAL
  Filled 2014-10-31: qty 4

## 2014-10-31 MED ORDER — INSULIN ASPART 100 UNIT/ML ~~LOC~~ SOLN
0.0000 [IU] | Freq: Every day | SUBCUTANEOUS | Status: DC
  Administered 2014-10-31: 4 [IU] via SUBCUTANEOUS

## 2014-10-31 MED ORDER — INSULIN ASPART 100 UNIT/ML ~~LOC~~ SOLN
0.0000 [IU] | Freq: Three times a day (TID) | SUBCUTANEOUS | Status: DC
  Administered 2014-11-01 (×2): 15 [IU] via SUBCUTANEOUS

## 2014-10-31 MED ORDER — INSULIN GLARGINE 100 UNIT/ML ~~LOC~~ SOLN
10.0000 [IU] | Freq: Every day | SUBCUTANEOUS | Status: DC
  Administered 2014-10-31 – 2014-11-01 (×2): 10 [IU] via SUBCUTANEOUS
  Filled 2014-10-31 (×3): qty 0.1

## 2014-10-31 MED ORDER — CEFTRIAXONE SODIUM IN DEXTROSE 20 MG/ML IV SOLN
1.0000 g | INTRAVENOUS | Status: DC
  Administered 2014-10-31 – 2014-11-01 (×2): 1 g via INTRAVENOUS
  Filled 2014-10-31 (×2): qty 50

## 2014-10-31 NOTE — Progress Notes (Addendum)
Subjective:  Appreciate Dr. Gwendolyn GrantFeinstein's care.  Extubated this morning. No c/o SOB or chest pain.  No real premonitory symptoms of chest pain, nausea prior to event.  Objective:  Vital Signs in the last 24 hours: BP 110/54 mmHg  Pulse 96  Temp(Src) 98.5 F (36.9 C) (Oral)  Resp 19  Ht 6\' 1"  (1.854 m)  Wt 98.9 kg (218 lb 0.6 oz)  BMI 28.77 kg/m2  SpO2 100%  Physical Exam: Pleasant WM in NAD moderately obese Lungs:  Reduced breath sounds Cardiac:  Regular rhythm, normal S1 and S2, no S3 Abdomen:  Soft, nontender, no masses Extremities:  No edema present  Intake/Output from previous day: 02/21 0701 - 02/22 0700 In: 4408.1 [I.V.:3458.1; NG/GT:340; IV Piggyback:610] Out: 1460 [Urine:1460]  Weight Filed Weights   10/30/14 0000 10/31/14 0500  Weight: 94.6 kg (208 lb 8.9 oz) 98.9 kg (218 lb 0.6 oz)    Lab Results: Basic Metabolic Panel:  Recent Labs  16/06/9601/22/16 0745 10/31/14 1136  NA 140 143  K 3.2* 3.7  CL 116* 114*  CO2 17* 22  GLUCOSE 131* 166*  BUN 53* 54*  CREATININE 3.04* 2.97*   CBC:  Recent Labs  10/16/2014 1856 10/30/14 0400 10/31/14 0449  WBC 22.9* 17.8* 14.1*  NEUTROABS 18.1* 14.6*  --   HGB 11.3* 10.8* 10.3*  HCT 37.5* 33.4* 29.3*  MCV 111.6* 103.4* 93.9  PLT 322 263 205   Cardiac Enzymes: Troponin (Point of Care Test)  Recent Labs  10/26/2014 2043  TROPIPOC 1.25*   Cardiac Panel (last 3 results)  Recent Labs  10/30/14 0030 10/30/14 0630 10/30/14 1307  TROPONINI 11.93* 48.82* >80.00*    Telemetry: sinus  Assessment/Plan:  1. Definite acute non STEMI  May be in circumflex distribution 2. Stage 4-5 CKD 3. Diabetes with comps.  4. Prior pericardiectomy  Rec:  Await ECHO.  Repeat EKG today.  Heparin for 48 hours and then add plavix.  Add beta blocker when more stable.  No ACE due to renal failure.    Darden PalmerW. Spencer Tilley, Jr.  MD [facility] System[facility]ACC Cardiology  10/31/2014, 1:07 PM

## 2014-10-31 NOTE — Progress Notes (Addendum)
Inpatient Diabetes Program Recommendations  AACE/ADA: New Consensus Statement on Inpatient Glycemic Control (2013)  Target Ranges:  Prepandial:   less than 140 mg/dL      Peak postprandial:   less than 180 mg/dL (1-2 hours)      Critically ill patients:  140 - 180 mg/dL    Results for Brandon GlossSIGMON, Brandon B (MRN [id number]) as of 10/31/2014 16:39  Ref. Range 10/31/2014 04:26 10/31/2014 05:29 10/31/2014 06:33 10/31/2014 07:36 10/31/2014 08:33 10/31/2014 09:36 10/31/2014 10:32 10/31/2014 11:33 10/31/2014 12:32 10/31/2014 13:47 10/31/2014 14:37  Glucose-Capillary Latest Range: 70-99 mg/dL 782122 (H) 956132 (H) 213115 (H) 129 (H) 126 (H) 133 (H) 144 (H) 162 (H) 133 (H) 142 (H) 127 (H)    Chief Complaint: DKA/ Shock/ Elevated Troponins  History: DM, HTN, CKD4  Home DM Meds: Insulin Pump  Current DM Orders: Lantus 10 units daily               Novolog Moderate SSI Q4 hours   **Called patient's MD who manages his insulin pump (Dr. Jacky KindleAronson) [facility].  Per Dr. Lanell MatarAronson's CMA, patient's basal settings on the insulin pump are 0.8 units/hour from Midnight to 4am and 1.0 units/hour from 4am to Midnight (total basal per 24 hour period= 23.2 units).  CMA could not find carbohydrate ratio or sensitivity ratio in chart from PCP's office.  **Note patient was given 10 units Lantus at 12:30 pm today.  Also has orders for Novolog Moderate SSI q4 hours.  **[facility].  Dr. Vassie LollAlva would like to wait to see how patient's blood sugars respond to 10 units Lantus before increasing Lantus dose.  **Patient was moderately confused when I spoke with him.  Patient kept telling me that he was wearing his insulin pump, but I checked his body and he is NOT wearing his pump at this time.  Patient then told me the RN said the pump was on his leg.  This was not true as well.  ** At this time, patient does NOT meet safety criteria for using pump in hospital as he is confused.    Will follow  daily Ambrose FinlandJeannine Evans Sosha Shepherd RN, MSN, CDE Diabetes Coordinator Inpatient Diabetes Program Team Pager: [phone] (8a-10p)

## 2014-10-31 NOTE — Clinical Social Work Note (Signed)
Clinical Social Worker [facility]. CSW to complete psychosocial assessment and updated FL-2 as needed.   Derenda FennelBashira Khole Arterburn, MSW, LCSWA [phone] 10/31/2014 4:33 PM

## 2014-10-31 NOTE — Progress Notes (Signed)
Pt arrived to 6N29. Alert and oriented. Calm. Placed on oxygen 2L. Oriented to room and equipment. Will continue to monitor.

## 2014-10-31 NOTE — Progress Notes (Signed)
INITIAL NUTRITION ASSESSMENT  DOCUMENTATION CODES Per approved criteria  -Not Applicable   INTERVENTION:  Diet advancement as tolerated per MD, recommend CHO-modified diet.  NUTRITION DIAGNOSIS: Inadequate oral intake related to inability to eat as evidenced by NPO status.   Goal: Intake to meet >90% of estimated nutrition needs.  Monitor:  TF tolerance/adequacy, weight trend, labs, vent status.  Reason for Assessment: MD Consult for TF initiation and management.  81 y.o. male  Admitting Dx: elevated blood glucose and abdominal distention  ASSESSMENT: Patient presented to the hospital on 2/20 with AMS, shock, and elevated troponins in the setting of DKA.  Received consult for TF initiation and management. Patient was extubated this morning. TF now off.  Height: Ht Readings from Last 1 Encounters:  10/30/14 6\' 1"  (1.854 m)    Weight: Wt Readings from Last 1 Encounters:  10/31/14 218 lb 0.6 oz (98.9 kg)   Admission weight: 208 lb 8.9 oz (94.6 kg)  Ideal Body Weight: 83.6 kg  % Ideal Body Weight: 113%  Wt Readings from Last 10 Encounters:  10/31/14 218 lb 0.6 oz (98.9 kg)  05/08/14 223 lb (101.152 kg)  10/19/13 223 lb 12.3 oz (101.5 kg)  12/27/11 232 lb (105.235 kg)  10/28/11 231 lb 14.4 oz (105.189 kg)    Usual Body Weight: 223 lbs  % Usual Body Weight: 93%  BMI:  Body mass index is 28.77 kg/(m^2).  Estimated Nutritional Needs: Kcal: 2100-2300 Protein: 110-125 gm Fluid: >/= 2.1 L  Skin: intact  Diet Order: Diet NPO time specified  EDUCATION NEEDS: -Education not appropriate at this time   Intake/Output Summary (Last 24 hours) at 10/31/14 0935 Last data filed at 10/31/14 0800  Gross per 24 hour  Intake 4222.62 ml  Output   1755 ml  Net 2467.62 ml    Last BM: PTA   Labs:   Recent Labs Lab 10/30/14 1312  10/30/14 2329 10/30/14 2335 10/31/14 0400 10/31/14 0745  NA  --   < >  --  137 139 140  K  --   < >  --  3.5 3.1* 3.2*  CL  --    < >  --  112 116* 116*  CO2  --   < >  --  15* 16* 17*  BUN  --   < >  --  59* 56* 53*  CREATININE  --   < >  --  3.40* 3.27* 3.04*  CALCIUM  --   < >  --  7.9* 7.7* 7.6*  MG 2.4  --  2.2  --   --   --   PHOS 1.0*  --  2.6  --   --   --   GLUCOSE  --   < >  --  183* 142* 131*  < > = values in this interval not displayed.  CBG (last 3)   Recent Labs  10/31/14 0334 10/31/14 0426 10/31/14 0529  GLUCAP 151* 122* 132*    Scheduled Meds: . antiseptic oral rinse  7 mL Mouth Rinse QID  . aspirin EC  81 mg Oral Daily  . chlorhexidine  15 mL Mouth Rinse BID  . clopidogrel  75 mg Oral Q breakfast  . feeding supplement (PRO-STAT SUGAR FREE 64)  30 mL Per Tube BID  . feeding supplement (VITAL HIGH PROTEIN)  1,000 mL Per Tube Q24H  . finasteride  5 mg Oral Daily  . gabapentin  300 mg Oral TID  . pantoprazole (PROTONIX) IV  40 mg Intravenous Q24H  . phenylephrine 200 mcg / ml (NON-ED USE ONLY) injection  100 mcg Intracavernosal Once  . piperacillin-tazobactam (ZOSYN)  IV  2.25 g Intravenous 3 times per day  . pregabalin  50 mg Oral BID  . simvastatin  40 mg Oral QHS  . tamsulosin  0.4 mg Oral Daily    Continuous Infusions: . sodium chloride Stopped (10/30/14 1818)  . dextrose 5 % and 0.45% NaCl    . dextrose 5 % and 0.45% NaCl 125 mL/hr at 10/30/14 1900  . heparin 1,100 Units/hr (10/30/14 2340)  . insulin (NOVOLIN-R) infusion 6.2 Units/hr (10/31/14 0736)  . norepinephrine (LEVOPHED) Adult infusion Stopped (10/30/14 1610)    Past Medical History  Diagnosis Date  . Obesity   . DM retinopathy   . Unspecified venous (peripheral) insufficiency   . Anemia in chronic kidney disease(285.21)   . Unspecified vitamin D deficiency   . CAD (coronary artery disease) 10/15/2013  . Chronic kidney disease (CKD), stage IV (severe)   . BPH (benign prostatic hypertrophy) 10/15/2013  . Chronic systolic CHF (congestive heart failure)   . Hypertensive heart disease   . History of constrictive  pericarditis   . Hyperlipidemia   . Shortness of breath     "recently w/lying down and w/exertion" (10/15/2013)  . DM (diabetes mellitus) type II controlled with renal manifestation   . Spondylitis, cervical   . Arthritis     "hands and feet" (10/15/2013)    Past Surgical History  Procedure Laterality Date  . Pericardiectomy      2000  . Inguinal hernia repair  1980's  . Cardiac catheterization  2005  . Cataract extraction w/ intraocular lens  implant, bilateral Bilateral ?2000's    Joaquin Courts, RD, LDN, CNSC Pager [phone] After Hours Pager [phone]

## 2014-10-31 NOTE — Consult Note (Signed)
WOC wound consult note Consult requested for decubitus.  Assessed patient with bedside nurse during bath and skin is intact without any pressure ulcers. Please re-consult if further assistance is needed.  Thank-you,  Cammie Mcgeeawn Maizey Menendez MSN, RN, CWOCN, [place]CN-AP, CNS [phone]

## 2014-10-31 NOTE — Progress Notes (Signed)
  Echocardiogram 2D Echocardiogram has been performed.  Brandon Evans 10/31/2014, 11:06 AM

## 2014-10-31 NOTE — Procedures (Signed)
Extubation Procedure Note  Patient Details:   Name: Brandon Evans DOB: March 06, 1930 MRN: [id number]   Airway Documentation:     Evaluation  O2 sats: stable throughout Complications: No apparent complications Patient did tolerate procedure well. Bilateral Breath Sounds: Rhonchi, Other (Comment) (coarse) Suctioning: Oral, Airway Yes   Pt extubated to 2L [place]. Pt able to speak name and cough post extubation with no complications. Per RN, no plans to re-intubate and will keep comfortable. RT will continue to monitor.   Carolan ShiverKelley, Kaidence Sant M 10/31/2014, 10:33 AM

## 2014-10-31 NOTE — Progress Notes (Signed)
CRITICAL VALUE ALERT  Critical value received:  PH 7.6, Co2 15.2  Date of notification:  10/31/14  Time of notification:  0300  Critical value read back: yes  Nurse who received alert:  km  MD notified (1st page):  elink  Time MD responded:  0300  Reset resp rate to 25 and tidal volume to 600

## 2014-10-31 NOTE — Progress Notes (Signed)
CBG was 324. Pt did not have HS coverage ordered. Spoke with Dr. Vassie LollAlva about getting a nighttime coverage and he gave me orders to cover at night from now on. New orders was placed and pt was given 4 units of novolog.

## 2014-10-31 NOTE — Progress Notes (Signed)
PULMONARY / CRITICAL CARE MEDICINE HISTORY AND PHYSICAL EXAMINATION   Name: Brandon Evans MRN: [id number] DOB: 26-Aug-1930    ADMISSION DATE:  10/19/2014  PRIMARY SERVICE: PCCM  CHIEF COMPLAINT:  Elevated blood glucose and abdominal distention.  BRIEF PATIENT DESCRIPTION: 82 M with innumerable medical issues presenting with AMS, shock, elevated troponins all in the setting of DKA.   SIGNIFICANT EVENTS / STUDIES:  Intubated 2/20  LINES / TUBES: L IJ 2/20 ETT 2/20  CULTURES: Blood cultures x 2, 2/20>>> Urine Culture, 2/20>>> Tracheal Aspirate, 2/20>>>  ANTIBIOTICS: Vanc, 2/20>>>2/22 Zosyn, 2/20 -  SUBJECTIVE:  Put on a wean this am   VITAL SIGNS: Temp:  [97.6 F (36.4 C)-100 F (37.8 C)] 99.5 F (37.5 C) (02/22 0806) Pulse Rate:  [66-115] 82 (02/22 0812) Resp:  [0-34] 25 (02/22 0812) BP: (85-127)/(40-73) 123/47 mmHg (02/22 0812) SpO2:  [76 %-100 %] 100 % (02/22 0812) Arterial Line BP: (100-153)/(41-74) 116/46 mmHg (02/22 0800) FiO2 (%):  [40 %] 40 % (02/22 0812) Weight:  [98.9 kg (218 lb 0.6 oz)] 98.9 kg (218 lb 0.6 oz) (02/22 0500) HEMODYNAMICS: CVP:  [10 mmHg] 10 mmHg VENTILATOR SETTINGS: Vent Mode:  [-] PRVC FiO2 (%):  [40 %] 40 % Set Rate:  [25 bmp-34 bmp] 25 bmp Vt Set:  [600 mL-640 mL] 600 mL PEEP:  [5 cmH20] 5 cmH20 Plateau Pressure:  [17 cmH20-26 cmH20] 17 cmH20 INTAKE / OUTPUT: Intake/Output      02/21 0701 - 02/22 0700 02/22 0701 - 02/23 0700   I.V. (mL/kg) 3458.1 (35) 151.8 (1.5)   NG/GT 340 70   IV Piggyback 610    Total Intake(mL/kg) 4408.1 (44.6) 221.8 (2.2)   Urine (mL/kg/hr) 1460 (0.6) 325 (1.3)   Emesis/NG output  0 (0)   Total Output 1460 325   Net +2948.1 -103.2          PHYSICAL EXAMINATION: General:  Elderly M, Intubated. Wide awake  Neuro: follows commands well HEENT:  Sclera anicteric MMM, ETT present Neck: jvd normal Cardiovascular:  RRR,  NS1/S2, (-) MRG Lungs:  Coarse Abdomen:  S/NT/ND/(+)BS Musculoskeletal: 3   pitting edema to shins bilaterally Skin:  Chronic PAD changes in LE bilaterally  LABS:  CBC  Recent Labs Lab 10/21/2014 1856 10/30/14 0400 10/31/14 0449  WBC 22.9* 17.8* 14.1*  HGB 11.3* 10.8* 10.3*  HCT 37.5* 33.4* 29.3*  PLT 322 263 205   Coag's  Recent Labs Lab 10/30/14 0400  INR 1.37   BMET  Recent Labs Lab 10/30/14 2335 10/31/14 0400 10/31/14 0745  NA 137 139 140  K 3.5 3.1* 3.2*  CL 112 116* 116*  CO2 15* 16* 17*  BUN 59* 56* 53*  CREATININE 3.40* 3.27* 3.04*  GLUCOSE 183* 142* 131*   Electrolytes  Recent Labs Lab 10/30/14 1312  10/30/14 2329 10/30/14 2335 10/31/14 0400 10/31/14 0745  CALCIUM  --   < >  --  7.9* 7.7* 7.6*  MG 2.4  --  2.2  --   --   --   PHOS 1.0*  --  2.6  --   --   --   < > = values in this interval not displayed. Sepsis Markers  Recent Labs Lab 10/30/14 0027 10/30/14 0034 10/30/14 0334 10/30/14 0630 10/30/14 1422 10/31/14 0403  LATICACIDVEN  --  3.5* 3.8* 4.3*  --   --   PROCALCITON 12.67  --   --   --  15.05 11.15   ABG  Recent Labs Lab 10/30/14 0203 10/30/14 0419  10/31/14 0245  PHART 7.239* 7.284* 7.604*  PCO2ART 17.0* 17.4* 15.2*  PO2ART 159.0* 186.0* 176.0*   Liver Enzymes  Recent Labs Lab 11/05/2014 1856 10/30/14 0400  AST 44* 275*  ALT 25 83*  ALKPHOS 66 66  BILITOT 2.0* 1.9*  ALBUMIN 3.7 2.9*   Cardiac Enzymes  Recent Labs Lab 10/30/14 0030 10/30/14 0630 10/30/14 1307  TROPONINI 11.93* 48.82* >80.00*   Glucose  Recent Labs Lab 10/31/14 0023 10/31/14 0124 10/31/14 0230 10/31/14 0334 10/31/14 0426 10/31/14 0529  GLUCAP 169* 128* 138* 151* 122* 132*    Imaging Dg Abd 1 View  10/30/2014   CLINICAL DATA:  Abdominal distension, shock, diabetes, coronary artery disease, chronic kidney disease, chronic systolic CHF and hypertensive heart disease  EXAM: ABDOMEN - 1 VIEW  COMPARISON:  Portable exam 0634 hr compared to CT abdomen and pelvis 07/13/2014  FINDINGS: Tip of nasogastric tube is  in stomach.  Nonobstructive bowel gas pattern.  No bowel dilatation or bowel wall thickening.  Bones demineralized.  Scattered pelvic phleboliths without definite urinary tract calcification.  Atelectasis versus infiltrate and small effusion at LEFT lung base.  IMPRESSION: Nonobstructive bowel gas pattern.   Electronically Signed   By: Lavonia Dana M.D.   On: 10/30/2014 07:20   Dg Chest Port 1 View  10/31/2014   CLINICAL DATA:  Acute respiratory failure.  EXAM: PORTABLE CHEST - 1 VIEW  COMPARISON:  Portable chest x-rays of February 20th and October 30, 2014  FINDINGS: The lungs are adequately inflated. There is new increased density at the right lung base and mild progression of subsegmental atelectasis and probable pleural fluid at the left base. The cardiac silhouette remains enlarged. The pulmonary vascularity is mildly nor engorged and there is cephalization of the vascular pattern. The patient has undergone previous CABG. The endotracheal tube tip lies 4.3 cm above the crotch of the carina. The esophagogastric tube tip projects below the inferior margin of the image. The left internal jugular venous catheter tip projects over the midportion of the SVC. The observed bony thorax is unremarkable.  IMPRESSION: Congestive heart failure with mild pulmonary interstitial edema and bibasilar atelectasis and/or development of small pleural effusions. The support tubes are in appropriate position radiographically.   Electronically Signed   By: David  Martinique   On: 10/31/2014 07:43   Dg Chest Port 1 View  10/30/2014   CLINICAL DATA:  Respiratory failure  EXAM: PORTABLE CHEST - 1 VIEW  COMPARISON:  Portable exam 0630 hr compared to 10/11/2014  FINDINGS: Tip of endotracheal tube projects 4.7 cm above carina.  Nasogastric tube extends into stomach.  LEFT jugular central venous catheter tip projecting over SVC.  Enlargement of cardiac silhouette post median sternotomy.  Rotated to the LEFT.  Minimal RIGHT basilar  atelectasis.  Mild atelectasis versus consolidation in LEFT lower lobe with small LEFT pleural effusion.  Upper lungs clear.  No pneumothorax.  IMPRESSION: Mild atelectasis versus consolidation in LEFT lower lobe with small pleural effusion.  Minimal RIGHT base atelectasis.   Electronically Signed   By: Lavonia Dana M.D.   On: 10/30/2014 07:22   Dg Chest Portable 1 View  10/16/2014   CLINICAL DATA:  Hyperglycemia.  Intubated.  EXAM: PORTABLE CHEST - 1 VIEW  COMPARISON:  10/26/2014 at 19:04  FINDINGS: Endotracheal tube is between the clavicular heads and the carina. Nasogastric tube extends into the stomach and off the inferior edge of the image. Left jugular central line extends into the low SVC.  There is no  pneumothorax. There is moderate unchanged cardiomegaly. Mild basilar opacities are present, left greater than right, unchanged.  IMPRESSION: Line and tubes as described. No pneumothorax. Unchanged basilar opacities.   Electronically Signed   By: Andreas Newport M.D.   On: 11/05/2014 22:30   Dg Chest Port 1 View  11/02/2014   CLINICAL DATA:  Initial evaluation for altered mental status, abdominal distention for 3 weeks, hyperglycemia  EXAM: PORTABLE CHEST - 1 VIEW  COMPARISON:  10/16/2013  FINDINGS: Stable moderate enlargement of cardiac silhouette. Patient is status post median sternotomy. Vascular pattern normal. Mild bibasilar opacity most consistent with atelectasis.  IMPRESSION: Mild bilateral lower lobe atelectasis.   Electronically Signed   By: Skipper Cliche M.D.   On: 10/24/2014 19:17    EKG: Inferolateral ST depressions.  CXR: R directed ETT. Slightly deep CVC.  ASSESSMENT / PLAN:  Active Problems:   Acute systolic CHF (congestive heart failure)   CAD (coronary artery disease)   Multiple organ system failure   Shock   NSTEMI (non-ST elevated myocardial infarction)   Acute respiratory failure with hypoxemia   Bloated abdomen   PULMONARY A: Acute hypoxic respiratory failure ,  resp alk P:   Reduce to rate 16 when back to vent, if TV reduced Weaning now cpap 5 ps5, goal 1 hr Need to define reintubation status pre extubation  CARDIOVASCULAR A: Cardiogenic shock-->resolved  NSTEMI  CAD ICM HTN P:   Aggressive IVF maintain Check cortisol 51, no role steroids F/u echo- pending Asa, Heparin Drip Medical therapy  RENAL A: AKI on CKD: Hyperkalemia: improved  Hyponatremia: Likely 2/2 dehyrdation Metabolic acidosis/DKA - resolving NONAG noted P:   NO HD option resolving fxn now hypoK No saline with hyperchloremic acidosis that remains  GASTROINTESTINAL A: slight elevation ast Abdominal Distention: Benign exam. P:   Monitor CMPs Hold TF  For weaning Repeat ast in am  Statin OK for now  HEMATOLOGIC A:dvt prevention Anemia : Hep drip, consider dc at 48 hrs  INFECTIOUS A: PCT was 15 in setting renal failure, at risk aspiration, had LLL atx P:   BCX2 2/20>>> vanc 2/20>>>2/22 Zosyn 2/20>>>2/22 Will narrow to ceftriaxone and give total 5 days  ENDOCRINE A: DKA:  DM:  P:   DKA insulin drip protocol Plan to transition off  NEUROLOGIC A: Acute encephalopathy  - improved P:   Likely 2/2 metabolic derrangements and shock Avoid benzo  Ccm time 30 min   Lavon Paganini. Titus Mould, MD, [place] Pgr: [facility] 10/31/2014 9:30 AM

## 2014-10-31 NOTE — Progress Notes (Signed)
eLink Physician-Brief Progress Note Patient Name: Scharlene GlossWilliam B Daversa DOB: 09/29/1929 MRN: [id number]   Date of Service  10/31/2014  HPI/Events of Note  Patient with resp alkalosis on vent with pH of 7.6 and pCO2 of 15.  eICU Interventions  Plan: Decrease RR on vent from 34 to 25 Decrease TV to 600 cc Recheck ABG in AM        Imelda Dandridge 10/31/2014, 3:05 AM

## 2014-10-31 NOTE — Progress Notes (Signed)
ANTICOAGULATION CONSULT NOTE - Follow Up Consult  Pharmacy Consult for heparin Indication: chest pain/ACS  Allergies  Allergen Reactions  . Contrast Media [Iodinated Diagnostic Agents] Hives    Patient Measurements: Height:  (185.4 cm) Weight: 218 lb 0.6 oz (98.9 kg) IBW/kg (Calculated) : 79.9 Heparin Dosing Weight: 95 kg  Vital Signs: Temp: 98.5 F (36.9 C) (02/22 1139) Temp Source: Oral (02/22 1139) BP: 119/72 mmHg (02/22 1300) Pulse Rate: 95 (02/22 1300)  Labs:  Recent Labs  10/27/2014 1856  10/30/14 0030 10/30/14 0400  10/30/14 0630  10/30/14 1307  10/30/14 1710  10/31/14 0400 10/31/14 0403 10/31/14 0449 10/31/14 0745 10/31/14 1136  HGB 11.3*  --   --  10.8*  --   --   --   --   --   --   --   --   --  10.3*  --   --   HCT 37.5*  --   --  33.4*  --   --   --   --   --   --   --   --   --  29.3*  --   --   PLT 322  --   --  263  --   --   --   --   --   --   --   --   --  205  --   --   LABPROT  --   --   --  17.0*  --   --   --   --   --   --   --   --   --   --   --   --   INR  --   --   --  1.37  --   --   --   --   --   --   --   --   --   --   --   --   HEPARINUNFRC  --   --   --   --   --   --   --   --   --  0.40  --   --  0.62  --   --   --   CREATININE 3.46*  < >  --   --   < >  --   < >  --   < >  --   < > 3.27*  --   --  3.04* 2.97*  TROPONINI  --   --  11.93*  --   --  48.82*  --  >80.00*  --   --   --   --   --   --   --   --   < > = values in this interval not displayed.  Estimated Creatinine Clearance: 23.3 mL/min (by C-G formula based on Cr of 2.97).   Assessment: Patient is an 81 y.o M on heparin for ACS- NSTEMI.  Troponin was >80 at 1PM today.  Holding off on cath for now d/t poor renal function.   Heparin level is at goal on 1100 units/hr.  H/H is low but stable. Platelets 205. No bleeding reported. Plan per Cardiology is for 48 hours of heparin. Heparin initiated at 0121 AM on 10/30/14.    Goal of Therapy:  Heparin level 0.3-0.7  units/ml Monitor platelets by anticoagulation protocol: Yes   Plan:  - continue heparin drip at 1100 units/hr - Likely to d/c therapy on  11/02/2014 as this will be 48 hours.   Link SnufferJessica Librado Guandique, PharmD, BCPS Clinical Pharmacist [phone] 10/31/2014,3:11 PM

## 2014-11-01 LAB — COMPREHENSIVE METABOLIC PANEL
ALBUMIN: 2.8 g/dL — AB (ref 3.5–5.2)
ALK PHOS: 71 U/L (ref 39–117)
ALT: 91 U/L — AB (ref 0–53)
AST: 145 U/L — ABNORMAL HIGH (ref 0–37)
Anion gap: 9 (ref 5–15)
BUN: 52 mg/dL — ABNORMAL HIGH (ref 6–23)
CO2: 18 mmol/L — ABNORMAL LOW (ref 19–32)
Calcium: 7.9 mg/dL — ABNORMAL LOW (ref 8.4–10.5)
Chloride: 112 mmol/L (ref 96–112)
Creatinine, Ser: 2.9 mg/dL — ABNORMAL HIGH (ref 0.50–1.35)
GFR calc Af Amer: 22 mL/min — ABNORMAL LOW (ref >90)
GFR calc non Af Amer: 19 mL/min — ABNORMAL LOW (ref >90)
Glucose, Bld: 365 mg/dL — ABNORMAL HIGH (ref 70–99)
POTASSIUM: 5.8 mmol/L — AB (ref 3.5–5.1)
SODIUM: 139 mmol/L (ref 135–145)
Total Bilirubin: 1.6 mg/dL — ABNORMAL HIGH (ref 0.3–1.2)
Total Protein: 5.4 g/dL — ABNORMAL LOW (ref 6.0–8.3)

## 2014-11-01 LAB — GLUCOSE, CAPILLARY
Glucose-Capillary: 391 mg/dL — ABNORMAL HIGH (ref 70–99)
Glucose-Capillary: 377 mg/dL — ABNORMAL HIGH (ref 70–99)
Glucose-Capillary: 382 mg/dL — ABNORMAL HIGH (ref 70–99)

## 2014-11-01 LAB — CBC
HEMATOCRIT: 33.4 % — AB (ref 39.0–52.0)
Hemoglobin: 10.9 g/dL — ABNORMAL LOW (ref 13.0–17.0)
MCH: 32.2 pg (ref 26.0–34.0)
MCHC: 32.6 g/dL (ref 30.0–36.0)
MCV: 98.5 fL (ref 78.0–100.0)
Platelets: 172 10*3/uL (ref 150–400)
RBC: 3.39 MIL/uL — ABNORMAL LOW (ref 4.22–5.81)
RDW: 14.6 % (ref 11.5–15.5)
WBC: 13.6 10*3/uL — ABNORMAL HIGH (ref 4.0–10.5)

## 2014-11-01 LAB — HEPARIN LEVEL (UNFRACTIONATED): Heparin Unfractionated: 0.67 IU/mL (ref 0.30–0.70)

## 2014-11-01 MED ORDER — CARVEDILOL 3.125 MG PO TABS
3.1250 mg | ORAL_TABLET | Freq: Two times a day (BID) | ORAL | Status: DC
  Administered 2014-11-01: 3.125 mg via ORAL
  Filled 2014-11-01: qty 1

## 2014-11-01 MED ORDER — FUROSEMIDE 40 MG PO TABS: 40.0000 mg | ORAL_TABLET | Freq: Every day | ORAL | Status: DC

## 2014-11-01 MED ORDER — PANTOPRAZOLE SODIUM 40 MG PO TBEC: 40.0000 mg | DELAYED_RELEASE_TABLET | Freq: Every day | ORAL | Status: DC

## 2014-11-01 MED ORDER — GLUCERNA SHAKE PO LIQD
237.0000 mL | Freq: Two times a day (BID) | ORAL | Status: DC
  Administered 2014-11-01: 237 mL via ORAL

## 2014-11-01 MED ORDER — ENOXAPARIN SODIUM 30 MG/0.3ML ~~LOC~~ SOLN
30.0000 mg | SUBCUTANEOUS | Status: DC
  Administered 2014-11-01: 30 mg via SUBCUTANEOUS
  Filled 2014-11-01: qty 0.3

## 2014-11-01 MED ORDER — FUROSEMIDE 40 MG PO TABS
40.0000 mg | ORAL_TABLET | Freq: Two times a day (BID) | ORAL | Status: DC
  Administered 2014-11-01: 40 mg via ORAL
  Filled 2014-11-01: qty 1

## 2014-11-01 MED ORDER — PNEUMOCOCCAL VAC POLYVALENT 25 MCG/0.5ML IJ INJ
0.5000 mL | INJECTION | INTRAMUSCULAR | Status: DC
Start: 2014-11-02 — End: 2014-11-01

## 2014-11-01 MED ORDER — INFLUENZA VAC SPLIT QUAD 0.5 ML IM SUSY: 0.5000 mL | PREFILLED_SYRINGE | INTRAMUSCULAR | Status: DC

## 2014-11-02 LAB — CULTURE, RESPIRATORY

## 2014-11-02 LAB — CULTURE, RESPIRATORY W GRAM STAIN

## 2014-11-05 LAB — CULTURE, BLOOD (ROUTINE X 2)
Culture: NO GROWTH
Culture: NO GROWTH

## 2014-11-07 NOTE — Discharge Summary (Signed)
DISCHARGE SUMMARY  Brandon Evans  MR#: [id number]  DOB:1929/12/01  Date of Admission: 10/26/2014 Date of Discharge: 11/07/2014  Attending Physician:Quintella Mura A  Patient's WUJ:WJXBJYN,WGNFAOZ A, MD  Consults:Treatment Team:  Othella Boyer, MD  Discharge Diagnoses: Active Problems:   Acute systolic CHF (congestive heart failure)   CAD (coronary artery disease)   Multiple organ system failure   Shock   NSTEMI (non-ST elevated myocardial infarction)   Acute respiratory failure with hypoxemia   Bloated abdomen   Acute respiratory failure   Discharge Medications:   Medication List    ASK your doctor about these medications        aspirin EC 81 MG tablet  Take 81 mg by mouth daily.     carvedilol 6.25 MG tablet  Commonly known as:  COREG  Take 6.25 mg by mouth 2 (two) times daily with a meal.     clobetasol cream 0.05 %  Commonly known as:  TEMOVATE  Apply 1 application topically at bedtime as needed (for rash).     clopidogrel 75 MG tablet  Commonly known as:  PLAVIX  Take 75 mg by mouth daily with breakfast.     finasteride 5 MG tablet  Commonly known as:  PROSCAR  Take 5 mg by mouth daily.     furosemide 80 MG tablet  Commonly known as:  LASIX  Take 1 tablet (80 mg total) by mouth daily.     gabapentin 300 MG capsule  Commonly known as:  NEURONTIN  Take 300 mg by mouth 3 (three) times daily as needed (nerve pain).     HYDROcodone-acetaminophen 10-325 MG per tablet  Commonly known as:  NORCO  Take 1 tablet by mouth 4 (four) times daily as needed (pain).     insulin pump Soln  Inject into the skin continuous. Novolog insulin; max 134 units daily     isosorbide dinitrate 20 MG tablet  Commonly known as:  ISORDIL  Take 1 tablet (20 mg total) by mouth 2 (two) times daily.     meclizine 25 MG tablet  Commonly known as:  ANTIVERT  Take 25 mg by mouth 3 (three) times daily as needed for dizziness.     nitroGLYCERIN 0.4 MG SL tablet  Commonly  known as:  NITROSTAT  Place 0.4 mg under the tongue every 5 (five) minutes as needed for chest pain.     pregabalin 50 MG capsule  Commonly known as:  LYRICA  Take 1 capsule (50 mg total) by mouth 2 (two) times daily.     simvastatin 40 MG tablet  Commonly known as:  ZOCOR  Take 40 mg by mouth at bedtime.     tamsulosin 0.4 MG Caps capsule  Commonly known as:  FLOMAX  Take 0.8 mg by mouth at bedtime.     triamcinolone cream 0.1 %  Commonly known as:  KENALOG  Apply 1 application topically at bedtime as needed (for rash).        Hospital Procedures: Dg Abd 1 View  10/30/2014   CLINICAL DATA:  Abdominal distension, shock, diabetes, coronary artery disease, chronic kidney disease, chronic systolic CHF and hypertensive heart disease  EXAM: ABDOMEN - 1 VIEW  COMPARISON:  Portable exam 0634 hr compared to CT abdomen and pelvis 07/13/2014  FINDINGS: Tip of nasogastric tube is in stomach.  Nonobstructive bowel gas pattern.  No bowel dilatation or bowel wall thickening.  Bones demineralized.  Scattered pelvic phleboliths without definite urinary tract calcification.  Atelectasis versus infiltrate and  small effusion at LEFT lung base.  IMPRESSION: Nonobstructive bowel gas pattern.   Electronically Signed   By: Ulyses Southward M.D.   On: 10/30/2014 07:20   Dg Chest Port 1 View  10/31/2014   CLINICAL DATA:  Acute respiratory failure.  EXAM: PORTABLE CHEST - 1 VIEW  COMPARISON:  Portable chest x-rays of February 20th and October 30, 2014  FINDINGS: The lungs are adequately inflated. There is new increased density at the right lung base and mild progression of subsegmental atelectasis and probable pleural fluid at the left base. The cardiac silhouette remains enlarged. The pulmonary vascularity is mildly nor engorged and there is cephalization of the vascular pattern. The patient has undergone previous CABG. The endotracheal tube tip lies 4.3 cm above the crotch of the carina. The esophagogastric tube tip  projects below the inferior margin of the image. The left internal jugular venous catheter tip projects over the midportion of the SVC. The observed bony thorax is unremarkable.  IMPRESSION: Congestive heart failure with mild pulmonary interstitial edema and bibasilar atelectasis and/or development of small pleural effusions. The support tubes are in appropriate position radiographically.   Electronically Signed   By: David  [place]   On: 10/31/2014 07:43   Dg Chest Port 1 View  10/30/2014   CLINICAL DATA:  Respiratory failure  EXAM: PORTABLE CHEST - 1 VIEW  COMPARISON:  Portable exam 0630 hr compared to 2014/11/03  FINDINGS: Tip of endotracheal tube projects 4.7 cm above carina.  Nasogastric tube extends into stomach.  LEFT jugular central venous catheter tip projecting over SVC.  Enlargement of cardiac silhouette post median sternotomy.  Rotated to the LEFT.  Minimal RIGHT basilar atelectasis.  Mild atelectasis versus consolidation in LEFT lower lobe with small LEFT pleural effusion.  Upper lungs clear.  No pneumothorax.  IMPRESSION: Mild atelectasis versus consolidation in LEFT lower lobe with small pleural effusion.  Minimal RIGHT base atelectasis.   Electronically Signed   By: Ulyses Southward M.D.   On: 10/30/2014 07:22   Dg Chest Portable 1 View  2014/11/03   CLINICAL DATA:  Hyperglycemia.  Intubated.  EXAM: PORTABLE CHEST - 1 VIEW  COMPARISON:  03-Nov-2014 at 19:04  FINDINGS: Endotracheal tube is between the clavicular heads and the carina. Nasogastric tube extends into the stomach and off the inferior edge of the image. Left jugular central line extends into the low SVC.  There is no pneumothorax. There is moderate unchanged cardiomegaly. Mild basilar opacities are present, left greater than right, unchanged.  IMPRESSION: Line and tubes as described. No pneumothorax. Unchanged basilar opacities.   Electronically Signed   By: Ellery Plunk M.D.   On: 11/03/2014 22:30   Dg Chest Port 1 View  03-Nov-2014    CLINICAL DATA:  Initial evaluation for altered mental status, abdominal distention for 3 weeks, hyperglycemia  EXAM: PORTABLE CHEST - 1 VIEW  COMPARISON:  10/16/2013  FINDINGS: Stable moderate enlargement of cardiac silhouette. Patient is status post median sternotomy. Vascular pattern normal. Mild bibasilar opacity most consistent with atelectasis.  IMPRESSION: Mild bilateral lower lobe atelectasis.   Electronically Signed   By: Esperanza Heir M.D.   On: 2014/11/03 19:17    History of Present Illness: 81 y.o. male with pertinent PMH of DM, CAD, sCHF presents with altered mental statuts described as confusion and combativeness over the last day. He was hyperglycemic prior to arrival, with EMS reading high. On arrival today vitals signs and physical exam as above. Patient has no focal neurodeficits. Oriented 2.  I attempted to contact the pt's wife numerous times, and police sent. To all knowledge, the pt is full code.  Wu as above with dka, severe acidosis, multi organ system failure. No signs of infection. EKG with nonspecific findings, however concerning for possible cardiac pathology, however most consistent with global ischemia. Consulted cardiology who will follow along, however given overall clinical scenario, do not feel active intervention warranted at this time. Levophed started for BP. Intubated and central line placed. [facility] Dr. Sherene SiresWert accepting   Hospital Course: See ccm notes. Stabilized, extubated and transferred to floor.  Seen by me upon transfer of service am of death.  Was doing well on orals.  Underlying cardiac and renal disease were life limiting.  Details unclear but was found without cardiac or respiratory activity and was pronounced by house coverage.  Given circumstances, believe another cardiac event.  Day of Discharge Exam BP 106/67 mmHg  Pulse 96  Temp(Src) 98.2 F (36.8 C) (Oral)  Resp 20  Ht 5\' 9"  (1.753 m)  Wt 105.915 kg (233 lb 8 oz)  BMI  34.47 kg/m2  SpO2 100%  Discharge Labs: No results for input(s): NA, K, CL, CO2, GLUCOSE, BUN, CREATININE, CALCIUM, MG, PHOS in the last 72 hours. No results for input(s): AST, ALT, ALKPHOS, BILITOT, PROT, ALBUMIN in the last 72 hours. No results for input(s): WBC, NEUTROABS, HGB, HCT, MCV, PLT in the last 72 hours. No results for input(s): CKTOTAL, CKMB, CKMBINDEX, TROPONINI in the last 72 hours. No results for input(s): TSH, T4TOTAL, T3FREE, THYROIDAB in the last 72 hours.  Invalid input(s): FREET3 No results for input(s): VITAMINB12, FOLATE, FERRITIN, TIBC, IRON, RETICCTPCT in the last 72 hours.  Discharge instructions:   Disposition:   Tests Needing Follow-up:   Signed: Symone Cornman A 11/07/2014, 8:07 AM

## 2014-11-08 NOTE — Progress Notes (Signed)
Pharmacist Heart Failure Core Measure Documentation  Assessment: Brandon GlossWilliam B Evans has an EF documented as 25-30% on 10/31/14 by Dr. Donnie Ahoilley.  Rationale: Heart failure patients with left ventricular systolic dysfunction (LVSD) and an EF < 40% should be prescribed an angiotensin converting enzyme inhibitor (ACEI) or angiotensin receptor blocker (ARB) at discharge unless a contraindication is documented in the medical record.  This patient is not currently on an ACEI or ARB for HF.  This note is being placed in the record in order to provide documentation that a contraindication to the use of these agents is present for this encounter.  ACE Inhibitor or Angiotensin Receptor Blocker is contraindicated (specify all that apply)  []   ACEI allergy AND ARB allergy []   Angioedema []   Moderate or severe aortic stenosis []   Hyperkalemia []   Hypotension []   Renal artery stenosis [x]   Worsening renal function, preexisting renal disease or dysfunction   Brandon Evans 10/10/2014 1:09 PM

## 2014-11-08 NOTE — Clinical Social Work Psychosocial (Signed)
Clinical Social Work Department BRIEF PSYCHOSOCIAL ASSESSMENT 11-16-14  Patient:  Brandon Evans, Brandon Evans     Account Number:  [id number]     Admit date:  11/06/2014  Clinical Social Worker:  Delrae Sawyers  Date/Time:  Nov 16, 2014 01:39 PM  Referred by:  Physician  Date Referred:  11/16/14 Referred for  SNF Placement   Other Referral:   none.   Interview type:  Patient Other interview type:   CSW spoke with [facility] admissions liaison.  CSW spoke with patient's neice / POA.    PSYCHOSOCIAL DATA Living Status:  WIFE Admitted from facility:  [facility] Level of care:  [place] Primary support name:  Lelon Mast Primary support relationship to patient:  SPOUSE Degree of support available:   Patient is patient's wife caregiver.    CURRENT CONCERNS Current Concerns  Post-Acute Placement   Other Concerns:   none.    SOCIAL WORK ASSESSMENT / PLAN CSW recevied referral for possible SNF placement at time of discharge. Per MD report, patient admitted from [facility] where patient provides care for patient's wife. CSW spoke with [facility] admissions liaison regarding patient's options in SNF placement at facility. Per admissions liaison, patient able to admit to [facility] once medically stable for discharge.    CSW met with patient at bedside regarding discharge disposition. Patient stated patient will be discharging home with patient's wife and receiving home health services. CSW and patient discussed safety concerns of patient returning home. Patient acknowledged CSW concerns and thanked CSW for expressing interest in patient's quality of care. Patient informed CSW patient and patient's wife will be "more comfortable" at home.    CSW updated MD, RNCM, and MD. CSW updated [facility] admissions liaison. CSW signing off.   Assessment/plan status:  Psychosocial Support/Ongoing  Assessment of Needs Other assessment/ plan:   none.   Information/referral to community resources:   Patient to discharge home to [facility].    PATIENT'S/FAMILY'S RESPONSE TO PLAN OF CARE: Patient understanding and agreeable to CSW plan of care. Patient expressed no further questions or concerns at this time.       Lubertha Sayres, [place] ([phone]) Licensed Clinical Social Worker Orthopedics [phone]) and Surgical [phone])

## 2014-11-08 NOTE — Progress Notes (Signed)
Subjective:  He has been moved to the floor and is currently a no CODE BLUE.  He complains of mild shortness of breath today but is lucid and did not appear to be tachypnea.  His ejection fraction is now 25-30% and probably had occlusion in the circumflex system based on his EKG.  Objective:  Vital Signs in the last 24 hours: BP 106/60 mmHg  Pulse 95  Temp(Src) 97.8 F (36.6 C) (Oral)  Resp 20  Ht 5\' 9"  (1.753 m)  Wt 105.915 kg (233 lb 8 oz)  BMI 34.47 kg/m2  SpO2 99%  Physical Exam: Pleasant WM in NAD moderately obese Lungs:  Reduced breath sounds Cardiac:  Regular rhythm, normal S1 and S2, no S3 Abdomen:  Soft, nontender, no masses, distended abdomen Extremities:  1+ edema present  Intake/Output from previous day: 02/22 0701 - 02/23 0700 In: 2198.9 [P.O.:340; I.V.:1678.9; NG/GT:130; IV Piggyback:50] Out: 1700 [Urine:1700]  Weight Filed Weights   10/30/14 0000 10/31/14 0500 10/31/14 2055  Weight: 94.6 kg (208 lb 8.9 oz) 98.9 kg (218 lb 0.6 oz) 105.915 kg (233 lb 8 oz)   Lab Results: Basic Metabolic Panel:  Recent Labs  21/30/8602/22/16 2010 2014/09/18 0514  NA 137 139  K 4.9 5.8*  CL 113* 112  CO2 17* 18*  GLUCOSE 311* 365*  BUN 50* 52*  CREATININE 2.93* 2.90*   CBC:  Recent Labs  10/27/2014 1856 10/30/14 0400 10/31/14 0449 2014/09/18 0514  WBC 22.9* 17.8* 14.1* 13.6*  NEUTROABS 18.1* 14.6*  --   --   HGB 11.3* 10.8* 10.3* 10.9*  HCT 37.5* 33.4* 29.3* 33.4*  MCV 111.6* 103.4* 93.9 98.5  PLT 322 263 205 172   Cardiac Enzymes: Troponin (Point of Care Test)  Recent Labs  10/31/2014 2043  TROPIPOC 1.25*   Cardiac Panel (last 3 results)  Recent Labs  10/30/14 0030 10/30/14 0630 10/30/14 1307  TROPONINI 11.93* 48.82* >80.00*   Assessment/Plan:  1.  Recent acute myocardial infarction non-ST elevation but likely in circumflex distribution 2. Stage 4-5 CKD 3. Diabetes with comps.  4. Prior pericardiectomy  Rec:  I would stop his heparin and just use DVT  prophylaxis.  Continue some mild diuresis at this point.  We'll try to start beta blocker today.      Darden PalmerW. Spencer Nandita Evans, Jr.  MD [facility] Center[facility]ACC Cardiology  30-Jun-2015, 8:40 AM

## 2014-11-08 NOTE — Progress Notes (Signed)
Subjective: Awake, knows me, abit breathless with conversation-discussed disposition  Objective: Vital signs in last 24 hours: Temp:  [97.3 F (36.3 C)-99.5 F (37.5 C)] 97.8 F (36.6 C) (02/23 9604) Pulse Rate:  [47-103] 95 (02/23 0638) Resp:  [18-25] 20 (02/23 0638) BP: (103-123)/(47-72) 106/60 mmHg (02/23 0638) SpO2:  [99 %-100 %] 99 % (02/23 5409) Arterial Line BP: (116-132)/(46-54) 119/48 mmHg (02/22 1000) FiO2 (%):  [40 %] 40 % (02/22 0812) Weight:  [105.915 kg (233 lb 8 oz)] 105.915 kg (233 lb 8 oz) (02/22 2055) Weight change: 7.015 kg (15 lb 7.4 oz)  CBG (last 3)   Recent Labs  10/31/14 1437 10/31/14 1702 10/31/14 2059  GLUCAP 127* 218* 324*    Intake/Output from previous day: 02/22 0701 - 02/23 0700 In: 2198.9 [P.O.:340; I.V.:1678.9; NG/GT:130; IV Piggyback:50] Out: 1700 [Urine:1700]  Physical Exam: A/a-no distress, flat with oxygen 2cm jvd Chest with decreased bs bases-no rales or rhonchi Hs distant, rrr, no murmur Abdomen protuberant, soft, nontender 1+edema--chronic Neuro normal-weak   Lab Results:  Recent Labs  10/30/14 2329  10/31/14 1125  10/31/14 2010 2014-11-19 0514  NA  --   < >  --   < > 137 139  K  --   < >  --   < > 4.9 5.8*  CL  --   < >  --   < > 113* 112  CO2  --   < >  --   < > 17* 18*  GLUCOSE  --   < >  --   < > 311* 365*  BUN  --   < >  --   < > 50* 52*  CREATININE  --   < >  --   < > 2.93* 2.90*  CALCIUM  --   < >  --   < > 7.5* 7.9*  MG 2.2  --  2.2  --   --   --   PHOS 2.6  --  3.8  --   --   --   < > = values in this interval not displayed.  Recent Labs  10/30/14 0400 November 19, 2014 0514  AST 275* 145*  ALT 83* 91*  ALKPHOS 66 71  BILITOT 1.9* 1.6*  PROT 5.4* 5.4*  ALBUMIN 2.9* 2.8*    Recent Labs  10/18/2014 1856 10/30/14 0400 10/31/14 0449 11-19-14 0514  WBC 22.9* 17.8* 14.1* 13.6*  NEUTROABS 18.1* 14.6*  --   --   HGB 11.3* 10.8* 10.3* 10.9*  HCT 37.5* 33.4* 29.3* 33.4*  MCV 111.6* 103.4* 93.9 98.5  PLT 322  263 205 172   Lab Results  Component Value Date   INR 1.37 10/30/2014   INR 0.96 10/15/2013    Recent Labs  10/30/14 0030 10/30/14 0630 10/30/14 1307  TROPONINI 11.93* 48.82* >80.00*   No results for input(s): TSH, T4TOTAL, T3FREE, THYROIDAB in the last 72 hours.  Invalid input(s): FREET3 No results for input(s): VITAMINB12, FOLATE, FERRITIN, TIBC, IRON, RETICCTPCT in the last 72 hours.  Studies/Results: Dg Chest Port 1 View  10/31/2014   CLINICAL DATA:  Acute respiratory failure.  EXAM: PORTABLE CHEST - 1 VIEW  COMPARISON:  Portable chest x-rays of February 20th and October 30, 2014  FINDINGS: The lungs are adequately inflated. There is new increased density at the right lung base and mild progression of subsegmental atelectasis and probable pleural fluid at the left base. The cardiac silhouette remains enlarged. The pulmonary vascularity is mildly nor engorged and there is  cephalization of the vascular pattern. The patient has undergone previous CABG. The endotracheal tube tip lies 4.3 cm above the crotch of the carina. The esophagogastric tube tip projects below the inferior margin of the image. The left internal jugular venous catheter tip projects over the midportion of the SVC. The observed bony thorax is unremarkable.  IMPRESSION: Congestive heart failure with mild pulmonary interstitial edema and bibasilar atelectasis and/or development of small pleural effusions. The support tubes are in appropriate position radiographically.   Electronically Signed   By: David  [place]   On: 10/31/2014 07:43     Assessment/Plan: 1. nonstemi- dc heparin after today, bblocker per cardiology 2. chf-ischemic cardiomyopathy-compensated 3. Pneumonia- probable aspiration-antibiotics, stable 4. Cardiogenic shock-resolved 5. Acute respiratory failure due to chf and aspiration- resolved 6. DKA- resolved 7. TME-resolved 8. DM2 with renal, neuro and vascular complications-- lantus and ss 9. CKD4- not  hd candidate   LOS: 3 days   Aerik Polan A 11/04/2014, 7:34 AM

## 2014-11-08 NOTE — Progress Notes (Signed)
NUTRITION FOLLOW UP  Intervention:   Glucerna Shake po BID, each supplement provides 220 kcal and 10 grams of protein  Nutrition Dx:   Inadequate oral intake related to decreased appetite as evidenced by PO: 0%.   Goal:   Intake to meet >90% of estimated nutrition needs.  Monitor:   PO/supplement intake, labs, weight changes, I/O's  Assessment:   Patient presented to the hospital on 2/20 with AMS, shock, and elevated troponins in the setting of DKA.  Pt extubated on 10/31/14. He was subsequently transferred out of ICU to the floor.  Pt reports feeling a little better today. He reveals that his appetite is not very good. He denies any difficulty chewing or swallowing foods, but reports "I just don't want it". Meal intake 10%.  He shares with this RD that his appetite has been moderately decreased since August 2015 s/p fall. Reports UBW of 208-209#. He confirms wt gain due to edema. Physical exam revealed edema on hands and upper extremities.  Nutrition focused physical exam revealed no signs of fat and muscle depletion.    Discussed importance of good meal and supplement intake to promote healing. Pt agreeable to Glucerna to maximize nutritional intake.  Labs reviewed. Cl: 114, BUN/Creat: 51/2.86, Calcium: 7.8, Glucose: 181, CBGS: 218-391. Mg, K, and Phos WDL.   Height: Ht Readings from Last 1 Encounters:  10/31/14 5\' 9"  (1.753 m)    Weight Status:   Wt Readings from Last 1 Encounters:  10/31/14 233 lb 8 oz (105.915 kg)   10/31/14 218 lb 0.6 oz (98.9 kg)   Admission weight: 208 lb 8.9 oz (94.6 kg)     Re-estimated needs:  Kcal: 1800-2000 Protein: 90-100 grams Fluid: 1.8-2.0 L  Skin: Intact  Diet Order: Diet Heart   Intake/Output Summary (Last 24 hours) at 12/27/2014 1118 Last data filed at 12/27/2014 [id number]  Gross per 24 hour  Intake 1562.65 ml  Output   1100 ml  Net 462.65 ml    Last BM: 10/15/2014   Labs:   Recent Labs Lab 10/30/14 1312  10/30/14 2329   10/31/14 1125  10/31/14 1500 10/31/14 2010 12/27/2014 0514  NA  --   < >  --   < >  --   < > 142 137 139  K  --   < >  --   < >  --   < > 4.0 4.9 5.8*  CL  --   < >  --   < >  --   < > 114* 113* 112  CO2  --   < >  --   < >  --   < > 20 17* 18*  BUN  --   < >  --   < >  --   < > 51* 50* 52*  CREATININE  --   < >  --   < >  --   < > 2.86* 2.93* 2.90*  CALCIUM  --   < >  --   < >  --   < > 7.8* 7.5* 7.9*  MG 2.4  --  2.2  --  2.2  --   --   --   --   PHOS 1.0*  --  2.6  --  3.8  --   --   --   --   GLUCOSE  --   < >  --   < >  --   < > 181* 311* 365*  < > =  values in this interval not displayed.  CBG (last 3)   Recent Labs  10/31/14 1702 10/31/14 2059 11-29-14 0754  GLUCAP 218* 324* 391*    Scheduled Meds: . aspirin EC  81 mg Oral Daily  . carvedilol  3.125 mg Oral BID WC  . cefTRIAXone (ROCEPHIN)  IV  1 g Intravenous Q24H  . clopidogrel  75 mg Oral Q breakfast  . enoxaparin (LOVENOX) injection  30 mg Subcutaneous Q24H  . finasteride  5 mg Oral Daily  . furosemide  40 mg Oral BID  . gabapentin  300 mg Oral TID  . insulin aspart  0-15 Units Subcutaneous TID WC  . insulin aspart  0-5 Units Subcutaneous QHS  . insulin glargine  10 Units Subcutaneous Daily  . pantoprazole (PROTONIX) IV  40 mg Intravenous Q24H  . pregabalin  50 mg Oral BID  . simvastatin  40 mg Oral QHS  . tamsulosin  0.4 mg Oral Daily    Continuous Infusions:   Minah Axelrod A. Mayford Knife, RD, LDN, CDE Pager: [phone] After hours Pager: [phone]

## 2014-11-08 NOTE — Care Management (Signed)
10/13/2014 Important message from Medicare given to patient . Carlin Attridge RN BSN         

## 2014-11-08 DEATH — deceased
